# Patient Record
Sex: Female | Born: 1970 | Race: White | Hispanic: No | Marital: Married | Smoking: Former smoker
Health system: Southern US, Community
[De-identification: ages and names within clinical notes are randomized; demographics above are authoritative.]

## PROBLEM LIST (undated history)

## (undated) DIAGNOSIS — K635 Polyp of colon: Secondary | ICD-10-CM

## (undated) DIAGNOSIS — Z789 Other specified health status: Secondary | ICD-10-CM

## (undated) HISTORY — DX: Polyp of colon: K63.5

## (undated) HISTORY — PX: ABDOMINAL HYSTERECTOMY: SHX81

---

## 2004-01-09 ENCOUNTER — Inpatient Hospital Stay: Payer: Self-pay

## 2005-03-30 ENCOUNTER — Ambulatory Visit: Payer: Self-pay

## 2007-02-16 ENCOUNTER — Ambulatory Visit: Payer: Self-pay

## 2008-06-06 ENCOUNTER — Ambulatory Visit: Payer: Self-pay | Admitting: Gastroenterology

## 2008-09-02 DIAGNOSIS — E7800 Pure hypercholesterolemia, unspecified: Secondary | ICD-10-CM | POA: Insufficient documentation

## 2008-09-02 DIAGNOSIS — N958 Other specified menopausal and perimenopausal disorders: Secondary | ICD-10-CM | POA: Insufficient documentation

## 2008-09-02 DIAGNOSIS — L299 Pruritus, unspecified: Secondary | ICD-10-CM | POA: Insufficient documentation

## 2009-07-29 ENCOUNTER — Inpatient Hospital Stay: Payer: Self-pay

## 2010-06-02 ENCOUNTER — Ambulatory Visit: Payer: Self-pay

## 2011-02-12 ENCOUNTER — Ambulatory Visit: Payer: Self-pay | Admitting: Family Medicine

## 2011-10-06 ENCOUNTER — Ambulatory Visit: Payer: Self-pay

## 2014-08-01 ENCOUNTER — Telehealth: Payer: Self-pay | Admitting: Family Medicine

## 2014-08-01 DIAGNOSIS — E785 Hyperlipidemia, unspecified: Secondary | ICD-10-CM

## 2014-08-01 NOTE — Telephone Encounter (Signed)
Pt stated that she is having to clear her throat lately and it gets worse after she eats. Pt wanted to know if there was something she could try over the counter. Pt would also like to get a lab slip to have her cholesterol rechecked. Thanks TNP

## 2014-08-02 NOTE — Telephone Encounter (Signed)
May use Claritin for post nasal drip and warm saltwater gargles to relief throat irritation. Will need to order lipid panel and CMP to follow up on hyperlipidemia. Please notify patient.

## 2014-08-05 NOTE — Telephone Encounter (Signed)
Pt returning call.  CB#[phone] ext [phone]/MJ

## 2014-08-05 NOTE — Telephone Encounter (Signed)
LMTB, lab requisition printed at front desk for patient to pick up. NW

## 2014-08-05 NOTE — Telephone Encounter (Signed)
Patient advised as directed below. Patient verbalized understanding.NW

## 2015-04-24 LAB — HM MAMMOGRAPHY

## 2015-06-12 ENCOUNTER — Encounter: Payer: Self-pay | Admitting: Family Medicine

## 2015-06-19 DIAGNOSIS — Z8349 Family history of other endocrine, nutritional and metabolic diseases: Secondary | ICD-10-CM | POA: Insufficient documentation

## 2015-06-19 DIAGNOSIS — R42 Dizziness and giddiness: Secondary | ICD-10-CM | POA: Insufficient documentation

## 2015-06-19 DIAGNOSIS — F439 Reaction to severe stress, unspecified: Secondary | ICD-10-CM | POA: Insufficient documentation

## 2015-06-19 DIAGNOSIS — F4322 Adjustment disorder with anxiety: Secondary | ICD-10-CM | POA: Insufficient documentation

## 2015-06-19 DIAGNOSIS — J45991 Cough variant asthma: Secondary | ICD-10-CM | POA: Insufficient documentation

## 2015-06-19 DIAGNOSIS — G5600 Carpal tunnel syndrome, unspecified upper limb: Secondary | ICD-10-CM | POA: Insufficient documentation

## 2015-06-21 ENCOUNTER — Encounter: Payer: Self-pay | Admitting: Family Medicine

## 2015-06-21 ENCOUNTER — Ambulatory Visit (INDEPENDENT_AMBULATORY_CARE_PROVIDER_SITE_OTHER): Payer: PRIVATE HEALTH INSURANCE | Admitting: Family Medicine

## 2015-06-21 VITALS — BP 110/80 | HR 100 | Temp 98.7°F | Resp 20 | Wt 160.0 lb

## 2015-06-21 DIAGNOSIS — J302 Other seasonal allergic rhinitis: Secondary | ICD-10-CM

## 2015-06-21 DIAGNOSIS — J45991 Cough variant asthma: Secondary | ICD-10-CM | POA: Diagnosis not present

## 2015-06-21 MED ORDER — AZELASTINE-FLUTICASONE 137-50 MCG/ACT NA SUSP: 1.0000 | Freq: Two times a day (BID) | NASAL | Status: DC

## 2015-06-21 MED ORDER — ALBUTEROL SULFATE HFA 108 (90 BASE) MCG/ACT IN AERS: 2.0000 | INHALATION_SPRAY | Freq: Four times a day (QID) | RESPIRATORY_TRACT | Status: DC | PRN

## 2015-06-21 MED ORDER — FLUTICASONE-SALMETEROL 250-50 MCG/DOSE IN AEPB: 1.0000 | INHALATION_SPRAY | Freq: Two times a day (BID) | RESPIRATORY_TRACT | Status: DC

## 2015-06-21 NOTE — Progress Notes (Signed)
Patient ID: Heather Robertson, female   DOB: Jan 15, 1971, 45 y.o.   MRN: [id number]       Patient: Heather Robertson Female    DOB: Jan 15, 1971   45 y.o.   MRN: [id number] Visit Date: 06/21/2015  Today's Provider: Dortha Kernennis Chrismon, PA   Chief Complaint  Patient presents with  . Cough   Subjective:    Cough This is a new problem. The current episode started in the past 7 days. The problem has been gradually worsening. The cough is non-productive. Associated symptoms include nasal congestion, postnasal drip, rhinorrhea, shortness of breath and wheezing. The symptoms are aggravated by lying down. Treatments tried: claritin, Tylenol sinus. The treatment provided no relief. Her past medical history is significant for asthma and environmental allergies.    No past medical history on file. Patient Active Problem List   Diagnosis Date Noted  . Adjustment disorder with anxiety 06/19/2015  . Cough variant asthma 06/19/2015  . Carpal tunnel syndrome 06/19/2015  . Dizziness 06/19/2015  . Family history of alpha 1 antitrypsin deficiency 06/19/2015  . Feeling stressed out 06/19/2015  . Itch 09/02/2008  . Pure hypercholesterolemia 09/02/2008  . Artificial menopause 09/02/2008   Past Surgical History  Procedure Laterality Date  . Abdominal hysterectomy     Family History  Problem Relation Age of Onset  . Colon cancer Father   . Asthma Sister   . Hypertension Sister   . Alpha-1 antitrypsin deficiency Brother   . Leukemia Maternal Grandmother   . Heart attack Paternal Grandmother   . Hypertension Paternal Grandfather   . Diabetes Paternal Grandfather   . CVA Paternal Grandfather    Allergies  Allergen Reactions  . Azithromycin Hives  . Penicillins    Previous Medications   ESTROGENS, CONJUGATED, (PREMARIN) 0.625 MG TABLET        Review of Systems  HENT: Positive for postnasal drip and rhinorrhea.   Respiratory: Positive for cough, shortness of breath and wheezing.     Allergic/Immunologic: Positive for environmental allergies.    Social History  Substance Use Topics  . Smoking status: Former Games developermoker  . Smokeless tobacco: Never Used  . Alcohol Use: No   Objective:   BP 110/80 mmHg  Pulse 100  Temp(Src) 98.7 F (37.1 C) (Oral)  Resp 20  Wt 160 lb (72.576 kg)  SpO2 97%  Physical Exam  Constitutional: She is oriented to person, place, and time. She appears well-developed and well-nourished. No distress.  HENT:  Head: Normocephalic and atraumatic.  Right Ear: Hearing and external ear normal.  Left Ear: Hearing and external ear normal.  Nose: Nose normal.  Mouth/Throat: Oropharynx is clear and moist.  Eyes: Conjunctivae, EOM and lids are normal. Right eye exhibits no discharge. Left eye exhibits no discharge. No scleral icterus.  Neck: Neck supple.  Cardiovascular: Normal rate and regular rhythm.   Pulmonary/Chest: Effort normal and breath sounds normal. No respiratory distress.  Abdominal: Soft. Bowel sounds are normal.  Musculoskeletal: Normal range of motion.  Lymphadenopathy:    She has no cervical adenopathy.  Neurological: She is alert and oriented to person, place, and time.  Skin: Skin is intact. No lesion and no rash noted.  Psychiatric: She has a normal mood and affect. Her speech is normal and behavior is normal. Thought content normal.      Assessment & Plan:     1. Cough variant asthma Onset 2 days ago after exposure to yard work, pollen and dust 5 days ago. No fever or  sputum production. May use Advair BID and Albuterol prn rescue. Add Delsym or Mucinex-DM prn cough. Increase fluid intake and recheck prn. - Fluticasone-Salmeterol (ADVAIR DISKUS) 250-50 MCG/DOSE AEPB; Inhale 1 puff into the lungs 2 (two) times daily.  Dispense: 14 each; Refill: 0 - albuterol (PROVENTIL HFA;VENTOLIN HFA) 108 (90 Base) MCG/ACT inhaler; Inhale 2 puffs into the lungs every 6 (six) hours as needed for wheezing or shortness of breath.  Dispense: 1  Inhaler; Refill: 0  2. Other seasonal allergic rhinitis Sneezing and rhinorrhea over the past 4-5 days after yard work. Will give Dymista nasal spray BID and recheck if needed. - Azelastine-Fluticasone (DYMISTA) 137-50 MCG/ACT SUSP; Place 1 spray into the nose 2 (two) times daily.  Dispense: 6 g; Refill: 0       Dortha Kern, PA  [facility]

## 2015-06-24 ENCOUNTER — Encounter: Payer: Self-pay | Admitting: Family Medicine

## 2015-06-24 ENCOUNTER — Ambulatory Visit (INDEPENDENT_AMBULATORY_CARE_PROVIDER_SITE_OTHER): Payer: PRIVATE HEALTH INSURANCE | Admitting: Family Medicine

## 2015-06-24 VITALS — BP 102/62 | HR 86 | Temp 98.2°F | Resp 16 | Ht 62.0 in | Wt 162.6 lb

## 2015-06-24 DIAGNOSIS — E7800 Pure hypercholesterolemia, unspecified: Secondary | ICD-10-CM | POA: Diagnosis not present

## 2015-06-24 DIAGNOSIS — N958 Other specified menopausal and perimenopausal disorders: Secondary | ICD-10-CM

## 2015-06-24 DIAGNOSIS — Z8349 Family history of other endocrine, nutritional and metabolic diseases: Secondary | ICD-10-CM | POA: Diagnosis not present

## 2015-06-24 DIAGNOSIS — Z0000 Encounter for general adult medical examination without abnormal findings: Secondary | ICD-10-CM

## 2015-06-24 LAB — POCT URINALYSIS DIPSTICK
BILIRUBIN UA: NEGATIVE
Blood, UA: NEGATIVE
GLUCOSE UA: NEGATIVE
KETONES UA: NEGATIVE
LEUKOCYTES UA: NEGATIVE
NITRITE UA: NEGATIVE
Protein, UA: NEGATIVE
Spec Grav, UA: 1.025
Urobilinogen, UA: 0.2
pH, UA: 6

## 2015-06-24 NOTE — Progress Notes (Signed)
Patient ID: Heather Robertson, female   DOB: 08/12/1970, 46 y.o.   MRN: [id number]       Patient: Heather Robertson, Female    DOB: 05-Jul-1970, 45 y.o.   MRN: [id number] Visit Date: 06/24/2015  Today's Provider: Dortha Kern, PA   Chief Complaint  Patient presents with  . Annual Exam   Subjective:    Annual physical exam Heather Robertson is a 45 y.o. female who presents today for health maintenance and complete physical. She feels well. She reports exercising 2-3 days per week. She reports she is sleeping 5-7 hours per night.  -----------------------------------------------------------------  Tdap: 12/31/2011 Mammogram: 04/24/2015 Pap Smear: 04/24/2015   Review of Systems  Constitutional: Positive for fatigue.  HENT: Positive for sinus pressure.   Eyes:       Blurred vision at times   Respiratory: Positive for shortness of breath and wheezing.   Cardiovascular: Negative.   Gastrointestinal: Negative.   Endocrine: Negative.   Genitourinary: Negative.   Musculoskeletal: Negative.   Skin: Negative.   Allergic/Immunologic: Positive for environmental allergies.  Neurological: Negative.   Hematological: Negative.   Psychiatric/Behavioral: Negative.     Social History      She  reports that she has quit smoking. She has never used smokeless tobacco. She reports that she does not drink alcohol or use illicit drugs.       Social History   Social History  . Marital Status: Married    Spouse Name: N/A  . Number of Children: N/A  . Years of Education: N/A   Social History Main Topics  . Smoking status: Former Games developer  . Smokeless tobacco: Never Used  . Alcohol Use: No  . Drug Use: No  . Sexual Activity: Not Asked   Other Topics Concern  . None   Social History Narrative    History reviewed. No pertinent past medical history.   Patient Active Problem List   Diagnosis Date Noted  . Adjustment disorder with anxiety 06/19/2015  . Cough variant asthma 06/19/2015  .  Carpal tunnel syndrome 06/19/2015  . Dizziness 06/19/2015  . Family history of alpha 1 antitrypsin deficiency 06/19/2015  . Feeling stressed out 06/19/2015  . Itch 09/02/2008  . Pure hypercholesterolemia 09/02/2008  . Artificial menopause 09/02/2008    Past Surgical History  Procedure Laterality Date  . Abdominal hysterectomy      Family History        Family Status  Relation Status Death Age  . Mother Alive   . Father Alive   . Sister Alive   . Brother Alive   . Maternal Grandmother Deceased   . Maternal Grandfather Deceased     MVA  . Paternal Grandmother Deceased   . Paternal Grandfather Deceased   . Brother Alive         Her family history includes Alpha-1 antitrypsin deficiency in her brother; Asthma in her sister; CVA in her paternal grandfather; Colon cancer in her father; Diabetes in her paternal grandfather; Heart attack in her paternal grandmother; Hypertension in her paternal grandfather and sister; Leukemia in her maternal grandmother.    Allergies  Allergen Reactions  . Azithromycin Hives  . Penicillins     Previous Medications   ALBUTEROL (PROVENTIL HFA;VENTOLIN HFA) 108 (90 BASE) MCG/ACT INHALER    Inhale 2 puffs into the lungs every 6 (six) hours as needed for wheezing or shortness of breath.   AZELASTINE-FLUTICASONE (DYMISTA) 137-50 MCG/ACT SUSP    Place 1 spray into the nose 2 (  two) times daily.   ESTROGENS, CONJUGATED, (PREMARIN) 0.625 MG TABLET       FLUTICASONE-SALMETEROL (ADVAIR DISKUS) 250-50 MCG/DOSE AEPB    Inhale 1 puff into the lungs 2 (two) times daily.    Patient Care Team: Tamsen Roersennis E Chrismon, PA as PCP - General (Family Medicine)     Objective:   Vitals: BP 102/62 mmHg  Pulse 86  Temp(Src) 98.2 F (36.8 C) (Oral)  Resp 16  Ht 5\' 2"  (1.575 m)  Wt 162 lb 9.6 oz (73.755 kg)  BMI 29.73 kg/m2   Physical Exam  Constitutional: She is oriented to person, place, and time. She appears well-developed and well-nourished.  HENT:  Head:  Normocephalic and atraumatic.  Right Ear: External ear normal.  Left Ear: External ear normal.  Nose: Nose normal.  Mouth/Throat: Oropharynx is clear and moist.  Eyes: Conjunctivae and EOM are normal. Pupils are equal, round, and reactive to light. Right eye exhibits no discharge.  Neck: Normal range of motion. Neck supple. No tracheal deviation present. No thyromegaly present.  Cardiovascular: Normal rate, regular rhythm, normal heart sounds and intact distal pulses.   No murmur heard. Pulmonary/Chest: Effort normal and breath sounds normal. No respiratory distress. She has no wheezes. She has no rales. She exhibits no tenderness.  Abdominal: Soft. She exhibits no distension and no mass. There is no tenderness. There is no rebound and no guarding.  Genitourinary:  Pelvic and pap with mammograms by Dr. Luella Cookosenow (all tests normal) on 04-24-15.  Musculoskeletal: Normal range of motion. She exhibits no edema or tenderness.  Lymphadenopathy:    She has no cervical adenopathy.  Neurological: She is alert and oriented to person, place, and time. She has normal reflexes. No cranial nerve deficit. She exhibits normal muscle tone. Coordination normal.  Skin: Skin is warm and dry. No rash noted. No erythema.  Psychiatric: She has a normal mood and affect. Her behavior is normal. Judgment and thought content normal.   Depression Screen PHQ 2/9 Scores 06/24/2015  PHQ - 2 Score 0    Assessment & Plan:     Routine Health Maintenance and Physical Exam  Exercise Activities and Dietary recommendations Goals    Physically active with helping daughter in softball and occasionally go to the gym. Encouraged to follow low fat diet.      Immunization History  Administered Date(s) Administered  . Tdap 12/31/2011    Health Maintenance  Topic Date Due  . HIV Screening  03/11/1985  . INFLUENZA VACCINE  09/30/2015  . PAP SMEAR  04/23/2018  . TETANUS/TDAP  12/30/2021      Discussed health benefits  of physical activity, and encouraged her to engage in regular exercise appropriate for her age and condition.    -------------------------------------------------------------------- 1. Annual physical exam Good general health, GYN (Dr. Luella Cookosenow), got normal PAP, breast exam, PAP and Mammograms. Immunizations up to date. Recheck routine labs. - POCT urinalysis dipstick  2. Pure hypercholesterolemia Feeling well. Recommend low fat diet and exercise. Will check follow up labs. Last lipid panel on 05-07-14 showed total cholesterol 252, triglycerides 173, HDL 49 and LDL 168. Recheck routine labs and lipid panel. - CBC with Differential/Platelet - Comprehensive metabolic panel - Lipid panel - TSH  3. Artificial menopause Still on Premarin 0.625 mg qd per Dr. Luella Cookosenow. Had abdominal hysterectomy in 2003.  4. Family history of alpha 1 antitrypsin deficiency Brother having lung disease from this deficiency. This patient had a normal alpha-1-antitrypsin deficiency test (94 mg/dl) on 16-11-9610-14-12.

## 2015-06-24 NOTE — Patient Instructions (Signed)
Health Maintenance, Female Adopting a healthy lifestyle and getting preventive care can go a long way to promote health and wellness. Talk with your health care provider about what schedule of regular examinations is right for you. This is a good chance for you to check in with your provider about disease prevention and staying healthy. In between checkups, there are plenty of things you can do on your own. Experts have done a lot of research about which lifestyle changes and preventive measures are most likely to keep you healthy. Ask your health care provider for more information. WEIGHT AND DIET  Eat a healthy diet  Be sure to include plenty of vegetables, fruits, low-fat dairy products, and lean protein.  Do not eat a lot of foods high in solid fats, added sugars, or salt.  Get regular exercise. This is one of the most important things you can do for your health.  Most adults should exercise for at least 150 minutes each week. The exercise should increase your heart rate and make you sweat (moderate-intensity exercise).  Most adults should also do strengthening exercises at least twice a week. This is in addition to the moderate-intensity exercise.  Maintain a healthy weight  Body mass index (BMI) is a measurement that can be used to identify possible weight problems. It estimates body fat based on height and weight. Your health care provider can help determine your BMI and help you achieve or maintain a healthy weight.  For females 20 years of age and older:   A BMI below 18.5 is considered underweight.  A BMI of 18.5 to 24.9 is normal.  A BMI of 25 to 29.9 is considered overweight.  A BMI of 30 and above is considered obese.  Watch levels of cholesterol and blood lipids  You should start having your blood tested for lipids and cholesterol at 45 years of age, then have this test every 5 years.  You may need to have your cholesterol levels checked more often if:  Your lipid  or cholesterol levels are high.  You are older than 45 years of age.  You are at high risk for heart disease.  CANCER SCREENING   Lung Cancer  Lung cancer screening is recommended for adults 55-80 years old who are at high risk for lung cancer because of a history of smoking.  A yearly low-dose CT scan of the lungs is recommended for people who:  Currently smoke.  Have quit within the past 15 years.  Have at least a 30-pack-year history of smoking. A pack year is smoking an average of one pack of cigarettes a day for 1 year.  Yearly screening should continue until it has been 15 years since you quit.  Yearly screening should stop if you develop a health problem that would prevent you from having lung cancer treatment.  Breast Cancer  Practice breast self-awareness. This means understanding how your breasts normally appear and feel.  It also means doing regular breast self-exams. Let your health care provider know about any changes, no matter how small.  If you are in your 20s or 30s, you should have a clinical breast exam (CBE) by a health care provider every 1-3 years as part of a regular health exam.  If you are 40 or older, have a CBE every year. Also consider having a breast X-ray (mammogram) every year.  If you have a family history of breast cancer, talk to your health care provider about genetic screening.  If you   are at high risk for breast cancer, talk to your health care provider about having an MRI and a mammogram every year.  Breast cancer gene (BRCA) assessment is recommended for women who have family members with BRCA-related cancers. BRCA-related cancers include:  Breast.  Ovarian.  Tubal.  Peritoneal cancers.  Results of the assessment will determine the need for genetic counseling and BRCA1 and BRCA2 testing. Cervical Cancer Your health care provider may recommend that you be screened regularly for cancer of the pelvic organs (ovaries, uterus, and  vagina). This screening involves a pelvic examination, including checking for microscopic changes to the surface of your cervix (Pap test). You may be encouraged to have this screening done every 3 years, beginning at age 21.  For women ages 30-65, health care providers may recommend pelvic exams and Pap testing every 3 years, or they may recommend the Pap and pelvic exam, combined with testing for human papilloma virus (HPV), every 5 years. Some types of HPV increase your risk of cervical cancer. Testing for HPV may also be done on women of any age with unclear Pap test results.  Other health care providers may not recommend any screening for nonpregnant women who are considered low risk for pelvic cancer and who do not have symptoms. Ask your health care provider if a screening pelvic exam is right for you.  If you have had past treatment for cervical cancer or a condition that could lead to cancer, you need Pap tests and screening for cancer for at least 20 years after your treatment. If Pap tests have been discontinued, your risk factors (such as having a new sexual partner) need to be reassessed to determine if screening should resume. Some women have medical problems that increase the chance of getting cervical cancer. In these cases, your health care provider may recommend more frequent screening and Pap tests. Colorectal Cancer  This type of cancer can be detected and often prevented.  Routine colorectal cancer screening usually begins at 45 years of age and continues through 45 years of age.  Your health care provider may recommend screening at an earlier age if you have risk factors for colon cancer.  Your health care provider may also recommend using home test kits to check for hidden blood in the stool.  A small camera at the end of a tube can be used to examine your colon directly (sigmoidoscopy or colonoscopy). This is done to check for the earliest forms of colorectal  cancer.  Routine screening usually begins at age 50.  Direct examination of the colon should be repeated every 5-10 years through 45 years of age. However, you may need to be screened more often if early forms of precancerous polyps or small growths are found. Skin Cancer  Check your skin from head to toe regularly.  Tell your health care provider about any new moles or changes in moles, especially if there is a change in a mole's shape or color.  Also tell your health care provider if you have a mole that is larger than the size of a pencil eraser.  Always use sunscreen. Apply sunscreen liberally and repeatedly throughout the day.  Protect yourself by wearing long sleeves, pants, a wide-brimmed hat, and sunglasses whenever you are outside. HEART DISEASE, DIABETES, AND HIGH BLOOD PRESSURE   High blood pressure causes heart disease and increases the risk of stroke. High blood pressure is more likely to develop in:  People who have blood pressure in the high end   of the normal range (130-139/85-89 mm Hg).  People who are overweight or obese.  People who are African American.  If you are 38-23 years of age, have your blood pressure checked every 3-5 years. If you are 61 years of age or older, have your blood pressure checked every year. You should have your blood pressure measured twice--once when you are at a hospital or clinic, and once when you are not at a hospital or clinic. Record the average of the two measurements. To check your blood pressure when you are not at a hospital or clinic, you can use:  An automated blood pressure machine at a pharmacy.  A home blood pressure monitor.  If you are between 45 years and 39 years old, ask your health care provider if you should take aspirin to prevent strokes.  Have regular diabetes screenings. This involves taking a blood sample to check your fasting blood sugar level.  If you are at a normal weight and have a low risk for diabetes,  have this test once every three years after 45 years of age.  If you are overweight and have a high risk for diabetes, consider being tested at a younger age or more often. PREVENTING INFECTION  Hepatitis B  If you have a higher risk for hepatitis B, you should be screened for this virus. You are considered at high risk for hepatitis B if:  You were born in a country where hepatitis B is common. Ask your health care provider which countries are considered high risk.  Your parents were born in a high-risk country, and you have not been immunized against hepatitis B (hepatitis B vaccine).  You have HIV or AIDS.  You use needles to inject street drugs.  You live with someone who has hepatitis B.  You have had sex with someone who has hepatitis B.  You get hemodialysis treatment.  You take certain medicines for conditions, including cancer, organ transplantation, and autoimmune conditions. Hepatitis C  Blood testing is recommended for:  Everyone born from [birth year] through 1965.  Anyone with known risk factors for hepatitis C. Sexually transmitted infections (STIs)  You should be screened for sexually transmitted infections (STIs) including gonorrhea and chlamydia if:  You are sexually active and are younger than 45 years of age.  You are older than 45 years of age and your health care provider tells you that you are at risk for this type of infection.  Your sexual activity has changed since you were last screened and you are at an increased risk for chlamydia or gonorrhea. Ask your health care provider if you are at risk.  If you do not have HIV, but are at risk, it may be recommended that you take a prescription medicine daily to prevent HIV infection. This is called pre-exposure prophylaxis (PrEP). You are considered at risk if:  You are sexually active and do not regularly use condoms or know the HIV status of your partner(s).  You take drugs by injection.  You are sexually  active with a partner who has HIV. Talk with your health care provider about whether you are at high risk of being infected with HIV. If you choose to begin PrEP, you should first be tested for HIV. You should then be tested every 3 months for as long as you are taking PrEP.  PREGNANCY   If you are premenopausal and you may become pregnant, ask your health care provider about preconception counseling.  If you may  become pregnant, take 400 to 800 micrograms (mcg) of folic acid every day.  If you want to prevent pregnancy, talk to your health care provider about birth control (contraception). OSTEOPOROSIS AND MENOPAUSE   Osteoporosis is a disease in which the bones lose minerals and strength with aging. This can result in serious bone fractures. Your risk for osteoporosis can be identified using a bone density scan.  If you are 61 years of age or older, or if you are at risk for osteoporosis and fractures, ask your health care provider if you should be screened.  Ask your health care provider whether you should take a calcium or vitamin D supplement to lower your risk for osteoporosis.  Menopause may have certain physical symptoms and risks.  Hormone replacement therapy may reduce some of these symptoms and risks. Talk to your health care provider about whether hormone replacement therapy is right for you.  HOME CARE INSTRUCTIONS   Schedule regular health, dental, and eye exams.  Stay current with your immunizations.   Do not use any tobacco products including cigarettes, chewing tobacco, or electronic cigarettes.  If you are pregnant, do not drink alcohol.  If you are breastfeeding, limit how much and how often you drink alcohol.  Limit alcohol intake to no more than 1 drink per day for nonpregnant women. One drink equals 12 ounces of beer, 5 ounces of wine, or 1 ounces of hard liquor.  Do not use street drugs.  Do not share needles.  Ask your health care provider for help if  you need support or information about quitting drugs.  Tell your health care provider if you often feel depressed.  Tell your health care provider if you have ever been abused or do not feel safe at home.   This information is not intended to replace advice given to you by your health care provider. Make sure you discuss any questions you have with your health care provider.   Document Released: 08/31/2010 Document Revised: 03/08/2014 Document Reviewed: 01/17/2013 Elsevier Interactive Patient Education [employer].

## 2015-06-26 ENCOUNTER — Telehealth: Payer: Self-pay

## 2015-06-26 LAB — LIPID PANEL
CHOL/HDL RATIO: 7 ratio — AB (ref 0.0–4.4)
Cholesterol, Total: 231 mg/dL — ABNORMAL HIGH (ref 100–199)
HDL: 33 mg/dL — AB (ref >39)
LDL CALC: 123 mg/dL — AB (ref 0–99)
TRIGLYCERIDES: 374 mg/dL — AB (ref 0–149)
VLDL Cholesterol Cal: 75 mg/dL — ABNORMAL HIGH (ref 5–40)

## 2015-06-26 LAB — CBC WITH DIFFERENTIAL/PLATELET
BASOS ABS: 0 10*3/uL (ref 0.0–0.2)
BASOS: 1 %
EOS (ABSOLUTE): 0.2 10*3/uL (ref 0.0–0.4)
Eos: 3 %
Hematocrit: 39.3 % (ref 34.0–46.6)
Hemoglobin: 13.2 g/dL (ref 11.1–15.9)
IMMATURE GRANS (ABS): 0 10*3/uL (ref 0.0–0.1)
IMMATURE GRANULOCYTES: 0 %
LYMPHS: 28 %
Lymphocytes Absolute: 1.9 10*3/uL (ref 0.7–3.1)
MCH: 29.5 pg (ref 26.6–33.0)
MCHC: 33.6 g/dL (ref 31.5–35.7)
MCV: 88 fL (ref 79–97)
MONOS ABS: 0.5 10*3/uL (ref 0.1–0.9)
Monocytes: 8 %
NEUTROS PCT: 60 %
Neutrophils Absolute: 4 10*3/uL (ref 1.4–7.0)
PLATELETS: 321 10*3/uL (ref 150–379)
RBC: 4.47 x10E6/uL (ref 3.77–5.28)
RDW: 13.2 % (ref 12.3–15.4)
WBC: 6.6 10*3/uL (ref 3.4–10.8)

## 2015-06-26 LAB — COMPREHENSIVE METABOLIC PANEL
ALT: 15 IU/L (ref 0–32)
AST: 17 IU/L (ref 0–40)
Albumin/Globulin Ratio: 1.7 (ref 1.2–2.2)
Albumin: 4.4 g/dL (ref 3.5–5.5)
Alkaline Phosphatase: 79 IU/L (ref 39–117)
BUN/Creatinine Ratio: 15 (ref 9–23)
BUN: 9 mg/dL (ref 6–24)
Bilirubin Total: 0.4 mg/dL (ref 0.0–1.2)
CALCIUM: 9.4 mg/dL (ref 8.7–10.2)
CO2: 25 mmol/L (ref 18–29)
CREATININE: 0.62 mg/dL (ref 0.57–1.00)
Chloride: 99 mmol/L (ref 96–106)
GFR, EST AFRICAN AMERICAN: 126 mL/min/{1.73_m2} (ref >59)
GFR, EST NON AFRICAN AMERICAN: 109 mL/min/{1.73_m2} (ref >59)
GLOBULIN, TOTAL: 2.6 g/dL (ref 1.5–4.5)
Glucose: 86 mg/dL (ref 65–99)
Potassium: 5.2 mmol/L (ref 3.5–5.2)
Sodium: 140 mmol/L (ref 134–144)
TOTAL PROTEIN: 7 g/dL (ref 6.0–8.5)

## 2015-06-26 LAB — TSH: TSH: 2.82 u[IU]/mL (ref 0.450–4.500)

## 2015-06-26 NOTE — Telephone Encounter (Signed)
-----   Message from Tamsen Roersennis E Chrismon, [place] sent at 06/26/2015 11:55 AM EDT ----- All blood tests normal except cholesterol and triglycerides worse. Need Pravastatin or Lipitor 20 mg qd #30 & 3 RF. Recheck blood levels in 3 months.

## 2015-06-26 NOTE — Telephone Encounter (Signed)
LMTCB

## 2015-06-26 NOTE — Telephone Encounter (Signed)
Patient advised as directed below. Patient verbalized understanding.   Patient is requesting to try lifestyle changes before starting medication, and following up in 3 months.   Is that okay?

## 2015-06-27 ENCOUNTER — Telehealth: Payer: Self-pay | Admitting: Family Medicine

## 2015-06-27 MED ORDER — CETIRIZINE HCL 10 MG PO TABS: 10.0000 mg | ORAL_TABLET | Freq: Every day | ORAL | Status: DC

## 2015-06-27 MED ORDER — BENZONATATE 200 MG PO CAPS: 200.0000 mg | ORAL_CAPSULE | Freq: Three times a day (TID) | ORAL | Status: DC | PRN

## 2015-06-27 NOTE — Telephone Encounter (Signed)
Pt was in last Saturday and then Tuesday for a physical.  She said she has had chest congestion ongoing since she was in Saturday and is still not much better.  She wants to know if there is anything else she can take  Call back is [phone]  Thanks,  Barth Kirksteri

## 2015-06-27 NOTE — Telephone Encounter (Signed)
Sent in medication listed below. Patient advised. Correct pharmacy was listed.

## 2015-06-27 NOTE — Telephone Encounter (Signed)
May try one more time as long as we recheck progress in 3 months. Last check of cholesterol, before this time, was in March of 2016 and everything was high at that time. Low fat diet and regular exercise is the basis for control of cholesterol. May use extra fiber like Metamucil 1 tsp in 4-6 ounces of juice or water twice a day and Krill Oil daily.

## 2015-06-27 NOTE — Telephone Encounter (Signed)
Patient wants to start benzonatate and zyrtec first to see if that helps with her cough. Could you please write the instructions for the benzonatate? Thanks!

## 2015-06-27 NOTE — Telephone Encounter (Signed)
Benzonatate 200 mg one TID as needed for cough #21. The last pharmacy used was [facility] [address].

## 2015-06-27 NOTE — Telephone Encounter (Signed)
Please advise 

## 2015-06-27 NOTE — Telephone Encounter (Signed)
No sign of bacterial infection on blood counts reported on 06-25-15. If still using the Albuterol and Advair inhalers with Dymista nasal spray, we can add Zyrtec daily and call in Benzonatate for cough. If coughing up mucus with heavy color or blood in it, should check chest x-ray. Sometimes we have to consider prednisone dose packs, anyway. If this regimen doesn't work, will need to refer to an allergist or pulmonologist.

## 2015-06-27 NOTE — Telephone Encounter (Signed)
LMTCB

## 2015-06-27 NOTE — Telephone Encounter (Signed)
Patient advised as directed below. Patient verbalized understanding.  

## 2015-09-26 ENCOUNTER — Encounter: Payer: Self-pay | Admitting: Family Medicine

## 2015-09-26 ENCOUNTER — Ambulatory Visit (INDEPENDENT_AMBULATORY_CARE_PROVIDER_SITE_OTHER): Payer: PRIVATE HEALTH INSURANCE | Admitting: Family Medicine

## 2015-09-26 VITALS — BP 110/62 | HR 79 | Temp 98.6°F | Resp 16 | Wt 164.0 lb

## 2015-09-26 DIAGNOSIS — H6123 Impacted cerumen, bilateral: Secondary | ICD-10-CM

## 2015-09-26 NOTE — Progress Notes (Signed)
Patient: Heather Robertson Female    DOB: 1971/01/16   45 y.o.   MRN: [id number] Visit Date: 09/26/2015  Today's Provider: Dortha Kern, PA   Chief Complaint  Patient presents with  . Ear Fullness    x 2 months   Subjective:    Ear Fullness   There is pain in the left ear. Episode onset: 2 months ago. The problem occurs constantly. The problem has been unchanged. There has been no fever. The patient is experiencing no pain. Associated symptoms include hearing loss (decrease in hearing). Pertinent negatives include no abdominal pain, coughing, diarrhea, ear discharge, headaches, neck pain, rash, rhinorrhea, sore throat or vomiting. She has tried nothing for the symptoms.   No past medical history on file. Patient Active Problem List   Diagnosis Date Noted  . Adjustment disorder with anxiety 06/19/2015  . Cough variant asthma 06/19/2015  . Carpal tunnel syndrome 06/19/2015  . Dizziness 06/19/2015  . Family history of alpha 1 antitrypsin deficiency 06/19/2015  . Feeling stressed out 06/19/2015  . Itch 09/02/2008  . Pure hypercholesterolemia 09/02/2008  . Artificial menopause 09/02/2008   Past Surgical History:  Procedure Laterality Date  . ABDOMINAL HYSTERECTOMY     Family History  Problem Relation Age of Onset  . Colon cancer Father   . Asthma Sister   . Hypertension Sister   . Alpha-1 antitrypsin deficiency Brother   . Leukemia Maternal Grandmother   . Heart attack Paternal Grandmother   . Hypertension Paternal Grandfather   . Diabetes Paternal Grandfather   . CVA Paternal Grandfather    Allergies  Allergen Reactions  . Azithromycin Hives  . Penicillins    Current Meds  Medication Sig  . albuterol (PROVENTIL HFA;VENTOLIN HFA) 108 (90 Base) MCG/ACT inhaler Inhale 2 puffs into the lungs every 6 (six) hours as needed for wheezing or shortness of breath.  . estrogens, conjugated, (PREMARIN) 0.625 MG tablet   . [DISCONTINUED] Azelastine-Fluticasone (DYMISTA)  137-50 MCG/ACT SUSP Place 1 spray into the nose 2 (two) times daily.  . [DISCONTINUED] benzonatate (TESSALON) 200 MG capsule Take 1 capsule (200 mg total) by mouth 3 (three) times daily as needed for cough.  . [DISCONTINUED] Fluticasone-Salmeterol (ADVAIR DISKUS) 250-50 MCG/DOSE AEPB Inhale 1 puff into the lungs 2 (two) times daily.    Review of Systems  Constitutional: Negative for appetite change, chills, fatigue and fever.  HENT: Positive for hearing loss (decrease in hearing). Negative for congestion, ear discharge, ear pain, rhinorrhea and sore throat.        Pressure in left ear  Respiratory: Negative for cough, chest tightness and shortness of breath.   Cardiovascular: Negative for chest pain and palpitations.  Gastrointestinal: Negative for abdominal pain, diarrhea, nausea and vomiting.  Musculoskeletal: Negative for neck pain.  Skin: Negative for rash.  Neurological: Negative for dizziness, weakness and headaches.    Social History  Substance Use Topics  . Smoking status: Former Games developer  . Smokeless tobacco: Never Used  . Alcohol use No   Objective:   BP 110/62   Pulse 79   Temp 98.6 F (37 C) (Oral)   Resp 16   Wt 164 lb (74.4 kg)   SpO2 97% Comment: room air  BMI 30.00 kg/m   Physical Exam  Constitutional: She is oriented to person, place, and time. She appears well-developed and well-nourished.  HENT:  Head: Normocephalic.  Nose: Nose normal.  Mouth/Throat: Oropharynx is clear and moist.  Bilateral cerumen impactions with diminished  hearing.  Eyes: Conjunctivae and EOM are normal.  Neck: Neck supple. No thyromegaly present.  Cardiovascular: Normal rate, regular rhythm and normal heart sounds.   Pulmonary/Chest: Effort normal and breath sounds normal.  Abdominal: Soft. Bowel sounds are normal.  Musculoskeletal: Normal range of motion.  Lymphadenopathy:    She has no cervical adenopathy.  Neurological: She is alert and oriented to person, place, and time.    Skin: No rash noted.  Psychiatric: She has a normal mood and affect. Her behavior is normal.      Assessment & Plan:     1. Cerumen impaction, bilateral Onset over the past couple months with some irritation and diminished hearing today. After irrigation, hearing back to normal and no sign of infection. Recheck prn.       Dortha Kern, PA  [facility]

## 2015-10-17 ENCOUNTER — Encounter: Payer: Self-pay | Admitting: Family Medicine

## 2015-10-17 ENCOUNTER — Ambulatory Visit (INDEPENDENT_AMBULATORY_CARE_PROVIDER_SITE_OTHER): Payer: PRIVATE HEALTH INSURANCE | Admitting: Family Medicine

## 2015-10-17 VITALS — BP 102/68 | HR 74 | Temp 97.7°F | Resp 16 | Wt 163.4 lb

## 2015-10-17 DIAGNOSIS — G4762 Sleep related leg cramps: Secondary | ICD-10-CM | POA: Diagnosis not present

## 2015-10-17 MED ORDER — CYCLOBENZAPRINE HCL 5 MG PO TABS: ORAL_TABLET | ORAL | 0 refills | Status: DC

## 2015-10-17 NOTE — Patient Instructions (Signed)
Discussed stretching calves before going to bed. We will call you with the lab results.

## 2015-10-17 NOTE — Progress Notes (Signed)
Subjective:     Patient ID: Trevor MaceSherry B Seneca, female   DOB: [birth year], 45 y.o.   MRN: [id number]  HPI  Chief Complaint  Patient presents with  . Leg Pain    Patient comes in office today with concerns of cramping in her left leg for the past two weeks. Patient states that she has had cramping in her calf and on the lateral side of leg. Patient states that yesterday cramping had lasted for 2hrs.   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 No heavy exercise reported though states she does occasionally have numbness/tingling in her left thigh. Currently on Krill Oil and Benefiber to lower cholesterol. Has started a MVI due to her current sx.   Review of Systems     Objective:   Physical Exam  Constitutional: She appears well-developed and well-nourished. No distress.  Cardiovascular:  Pulses:      Dorsalis pedis pulses are 2+ on the right side, and 2+ on the left side.       Posterior tibial pulses are 2+ on the right side, and 2+ on the left side.  Musculoskeletal: She exhibits no edema (of lower extremities). Tenderness: no calf tenderness though there is mild tenderness in left lateral calf near  a small bruise.  SLRs to 90 degrees without discomfort       Assessment:    1. Nocturnal leg cramps - cyclobenzaprine (FLEXERIL) 5 MG tablet; Take at bedtime as needed for leg cramps.  Dispense: 14 tablet; Refill: 0 - Renal function panel - Ferritin - Magnesium    Plan:    Discussed stretching calves before bed. Further f/u pending lab work.

## 2015-10-18 LAB — RENAL FUNCTION PANEL
Albumin: 4.1 g/dL (ref 3.5–5.5)
BUN / CREAT RATIO: 21 (ref 9–23)
BUN: 12 mg/dL (ref 6–24)
CALCIUM: 9.2 mg/dL (ref 8.7–10.2)
CHLORIDE: 101 mmol/L (ref 96–106)
CO2: 22 mmol/L (ref 18–29)
Creatinine, Ser: 0.58 mg/dL (ref 0.57–1.00)
GFR calc non Af Amer: 112 mL/min/{1.73_m2} (ref >59)
GFR, EST AFRICAN AMERICAN: 129 mL/min/{1.73_m2} (ref >59)
GLUCOSE: 84 mg/dL (ref 65–99)
POTASSIUM: 4.5 mmol/L (ref 3.5–5.2)
Phosphorus: 3.3 mg/dL (ref 2.5–4.5)
Sodium: 140 mmol/L (ref 134–144)

## 2015-10-18 LAB — FERRITIN: Ferritin: 85 ng/mL (ref 15–150)

## 2015-10-18 LAB — MAGNESIUM: Magnesium: 2.1 mg/dL (ref 1.6–2.3)

## 2015-10-20 ENCOUNTER — Telehealth: Payer: Self-pay

## 2015-10-20 NOTE — Telephone Encounter (Signed)
-----   Message from Anola Gurneyobert Chauvin, [place] sent at 10/20/2015  7:40 AM EDT ----- Lab look good. Are the calf stretches and/or muscle relaxant helping?

## 2015-10-20 NOTE — Telephone Encounter (Signed)
LMTCB 10/20/2015  Thanks,   -Jonah Nestle  

## 2015-10-20 NOTE — Telephone Encounter (Signed)
Pt advised.  She reports the stretching seems to help; she has not had a flare to where she needs the muscle relaxer.   Thanks,   -Vernona RiegerLaura

## 2015-12-04 ENCOUNTER — Ambulatory Visit (INDEPENDENT_AMBULATORY_CARE_PROVIDER_SITE_OTHER): Payer: PRIVATE HEALTH INSURANCE | Admitting: Family Medicine

## 2015-12-04 ENCOUNTER — Encounter: Payer: Self-pay | Admitting: Family Medicine

## 2015-12-04 VITALS — BP 118/72 | HR 95 | Temp 98.2°F | Resp 18 | Wt 163.0 lb

## 2015-12-04 DIAGNOSIS — J302 Other seasonal allergic rhinitis: Secondary | ICD-10-CM | POA: Diagnosis not present

## 2015-12-04 DIAGNOSIS — J45991 Cough variant asthma: Secondary | ICD-10-CM

## 2015-12-04 MED ORDER — ALBUTEROL SULFATE HFA 108 (90 BASE) MCG/ACT IN AERS: 2.0000 | INHALATION_SPRAY | Freq: Four times a day (QID) | RESPIRATORY_TRACT | 5 refills | Status: DC | PRN

## 2015-12-04 MED ORDER — FLUTICASONE-SALMETEROL 250-50 MCG/DOSE IN AEPB: 1.0000 | INHALATION_SPRAY | Freq: Two times a day (BID) | RESPIRATORY_TRACT | 0 refills | Status: DC

## 2015-12-04 MED ORDER — FLUTICASONE PROPIONATE 50 MCG/ACT NA SUSP: 2.0000 | Freq: Every day | NASAL | 2 refills | Status: DC

## 2015-12-04 NOTE — Progress Notes (Signed)
Subjective:     Patient ID: Heather MaceSherry B Caradonna, female   DOB: 01/23/1971, 45 y.o.   MRN: [id number]  HPI  Chief Complaint  Patient presents with  . Cough    Patient reports that she has had a dry cough for about 2 weeks. She reports that she does have history of asthma. She reports that it could be asthma related. She reports that she does have a rescue inhaler that she uses occasionally. She also mentions that she has wheezing mainly at night.   States she started with allergy symptoms which she treated with Zyrtec and saline nasal spray. Sinuses improved but cough did not: "I don't feel sick". Has only used the albuterol one puff one to two times a day.   Review of Systems     Objective:   Physical Exam  Constitutional: She appears well-developed and well-nourished. No distress.  Ears: T.M's intact without inflammation Throat: no tonsillar enlargement or exudate Neck: no cervical adenopathy Lungs: clear     Assessment:    1. Cough variant asthma - Fluticasone-Salmeterol (ADVAIR DISKUS) 250-50 MCG/DOSE AEPB; Inhale 1 puff into the lungs 2 (two) times daily.  Dispense: 14 each; Refill: 0 - albuterol (PROVENTIL HFA;VENTOLIN HFA) 108 (90 Base) MCG/ACT inhaler; Inhale 2 puffs into the lungs every 6 (six) hours as needed for wheezing or shortness of breath.  Dispense: 1 Inhaler; Refill: 5  2. Other seasonal allergic rhinitis - fluticasone (FLONASE) 50 MCG/ACT nasal spray; Place 2 sprays into both nostrils daily.  Dispense: 16 g; Refill: 2    Plan:    Discussed scheduling the rescue inhaler until steroids controlling her sx. Suggested controlling her allergies with steroid nasal spray may also prevent the asthma.

## 2015-12-04 NOTE — Patient Instructions (Signed)
Schedule the rescue inhaler 2-4 x day until the steroid inhalers are controlling your symptoms.

## 2016-01-27 ENCOUNTER — Ambulatory Visit (INDEPENDENT_AMBULATORY_CARE_PROVIDER_SITE_OTHER): Payer: PRIVATE HEALTH INSURANCE | Admitting: Family Medicine

## 2016-01-27 ENCOUNTER — Encounter: Payer: Self-pay | Admitting: Family Medicine

## 2016-01-27 VITALS — BP 124/86 | HR 92 | Temp 98.7°F | Resp 16 | Wt 167.4 lb

## 2016-01-27 DIAGNOSIS — E7800 Pure hypercholesterolemia, unspecified: Secondary | ICD-10-CM

## 2016-01-27 DIAGNOSIS — R079 Chest pain, unspecified: Secondary | ICD-10-CM | POA: Diagnosis not present

## 2016-01-27 NOTE — Patient Instructions (Signed)
Chest Wall Pain °Chest wall pain is pain in or around the bones and muscles of your chest. Sometimes, an injury causes this pain. Sometimes, the cause may not be known. This pain may take several weeks or longer to get better. °Follow these instructions at home: °Pay attention to any changes in your symptoms. Take these actions to help with your pain: °· Rest as told by your health care provider. °· Avoid activities that cause pain. These include any activities that use your chest muscles or your abdominal and side muscles to lift heavy items. °· If directed, apply ice to the painful area: °¨ Put ice in a plastic bag. °¨ Place a towel between your skin and the bag. °¨ Leave the ice on for 20 minutes, 2-3 times per day. °· Take over-the-counter and prescription medicines only as told by your health care provider. °· Do not use tobacco products, including cigarettes, chewing tobacco, and e-cigarettes. If you need help quitting, ask your health care provider. °· Keep all follow-up visits as told by your health care provider. This is important. °Contact a health care provider if: °· You have a fever. °· Your chest pain becomes worse. °· You have new symptoms. °Get help right away if: °· You have nausea or vomiting. °· You feel sweaty or light-headed. °· You have a cough with phlegm (sputum) or you cough up blood. °· You develop shortness of breath. °This information is not intended to replace advice given to you by your health care provider. Make sure you discuss any questions you have with your health care provider. °Document Released: 02/15/2005 Document Revised: 06/26/2015 Document Reviewed: 05/13/2014 °Elsevier Interactive Patient Education © 2017 Elsevier Inc. ° °

## 2016-01-27 NOTE — Progress Notes (Signed)
Patient: Heather Robertson Female    DOB: 10-13-1970   45 y.o.   MRN: [id number] Visit Date: 01/27/2016  Today's Provider: Dortha Kernennis Ronella Plunk, PA   Chief Complaint  Patient presents with  . Chest Pain   Subjective:    Chest Pain   This is a new problem. Episode onset: Thursday. The onset quality is sudden (episode). The problem has been resolved (rapidly improved ). The pain is present in the substernal region. The quality of the pain is described as squeezing. The pain does not radiate. Associated symptoms comments: Sleeplessness . Treatments tried: aspirin 81 mg. The treatment provided significant relief.  Her past medical history is significant for anxiety/panic attacks and hyperlipidemia.  Her family medical history is significant for heart disease.   Patient Active Problem List   Diagnosis Date Noted  . Adjustment disorder with anxiety 06/19/2015  . Cough variant asthma 06/19/2015  . Carpal tunnel syndrome 06/19/2015  . Dizziness 06/19/2015  . Family history of alpha 1 antitrypsin deficiency 06/19/2015  . Feeling stressed out 06/19/2015  . Itch 09/02/2008  . Pure hypercholesterolemia 09/02/2008  . Artificial menopause 09/02/2008   Past Surgical History:  Procedure Laterality Date  . ABDOMINAL HYSTERECTOMY     Family History  Problem Relation Age of Onset  . Colon cancer Father   . Asthma Sister   . Hypertension Sister   . Alpha-1 antitrypsin deficiency Brother   . Leukemia Maternal Grandmother   . Heart attack Paternal Grandmother   . Hypertension Paternal Grandfather   . Diabetes Paternal Grandfather   . CVA Paternal Grandfather    Allergies  Allergen Reactions  . Azithromycin Hives  . Penicillins      Previous Medications   ALBUTEROL (PROVENTIL HFA;VENTOLIN HFA) 108 (90 BASE) MCG/ACT INHALER    Inhale 2 puffs into the lungs every 6 (six) hours as needed for wheezing or shortness of breath.   CETIRIZINE (ZYRTEC) 10 MG TABLET    Take 1 tablet (10 mg total) by  mouth daily.   CYCLOBENZAPRINE (FLEXERIL) 5 MG TABLET    Take at bedtime as needed for leg cramps.   ESTROGENS, CONJUGATED, (PREMARIN) 0.625 MG TABLET       FLUTICASONE (FLONASE) 50 MCG/ACT NASAL SPRAY    Place 2 sprays into both nostrils daily.   FLUTICASONE-SALMETEROL (ADVAIR DISKUS) 250-50 MCG/DOSE AEPB    Inhale 1 puff into the lungs 2 (two) times daily.   KRILL OIL 350 MG CAPS    Take by mouth 2 (two) times daily.   MULTIPLE VITAMINS-CALCIUM (ONE-A-DAY WOMENS PO)    Take 1 tablet by mouth daily.    Review of Systems  Constitutional: Negative.   Respiratory: Negative.   Cardiovascular: Positive for chest pain.  Psychiatric/Behavioral: Positive for sleep disturbance.    Social History  Substance Use Topics  . Smoking status: Former Games developermoker  . Smokeless tobacco: Never Used  . Alcohol use No   Objective:   BP 124/86 (BP Location: Right Arm, Patient Position: Sitting, Cuff Size: Normal)   Pulse 92   Temp 98.7 F (37.1 C) (Oral)   Resp 16   Wt 167 lb 6.4 oz (75.9 kg)   BMI 30.62 kg/m   Physical Exam  Constitutional: She is oriented to person, place, and time. She appears well-developed and well-nourished. No distress.  HENT:  Head: Normocephalic and atraumatic.  Right Ear: Hearing normal.  Left Ear: Hearing normal.  Nose: Nose normal.  Eyes: Conjunctivae and lids are normal. Right eye exhibits no  discharge. Left eye exhibits no discharge. No scleral icterus.  Neck: Neck supple. No thyromegaly present.  Cardiovascular: Normal rate, regular rhythm and normal heart sounds.  Exam reveals no gallop and no friction rub.   No murmur heard. Pulmonary/Chest: Effort normal and breath sounds normal. No respiratory distress.  Abdominal: Soft. Bowel sounds are normal.  Musculoskeletal: Normal range of motion.  Neurological: She is alert and oriented to person, place, and time.  Skin: Skin is intact. No lesion and no rash noted.  Psychiatric: She has a normal mood and affect. Her  speech is normal and behavior is normal. Thought content normal.      Assessment & Plan:     1. Chest pain, unspecified type Had a "grabbing" mid chest pain while walking in the grocery store 5 days ago. Sat down to rest, took a Baby ASA and it went away. Denies nausea, dyspnea or cough. EKG shows a short PR interval but otherwise normal. Worried about possible cardiovascular event. Will get labs with troponin and schedule cardiology referral. - EKG 12-Lead - CBC with Differential/Platelet - Comprehensive metabolic panel - Lipid panel - TSH - Troponin I - Ambulatory referral to Cardiology  2. Pure hypercholesterolemia Had total cholesterol 231, triglycerides 374, HDL 33 and LDL 123 on 06-25-15. Did not want to start a statin at that time and has been on [place] Oil and trying to follow a low fat diet. Will check progress. Follow up pending lab reports. - Comprehensive metabolic panel - Lipid panel - TSH

## 2016-01-29 ENCOUNTER — Telehealth: Payer: Self-pay

## 2016-01-29 LAB — CBC WITH DIFFERENTIAL/PLATELET
BASOS: 1 %
Basophils Absolute: 0 10*3/uL (ref 0.0–0.2)
EOS (ABSOLUTE): 0.3 10*3/uL (ref 0.0–0.4)
Eos: 4 %
HEMOGLOBIN: 13 g/dL (ref 11.1–15.9)
Hematocrit: 38.5 % (ref 34.0–46.6)
IMMATURE GRANS (ABS): 0 10*3/uL (ref 0.0–0.1)
Immature Granulocytes: 0 %
LYMPHS: 29 %
Lymphocytes Absolute: 1.9 10*3/uL (ref 0.7–3.1)
MCH: 29.6 pg (ref 26.6–33.0)
MCHC: 33.8 g/dL (ref 31.5–35.7)
MCV: 88 fL (ref 79–97)
MONOCYTES: 10 %
Monocytes Absolute: 0.6 10*3/uL (ref 0.1–0.9)
NEUTROS ABS: 3.7 10*3/uL (ref 1.4–7.0)
Neutrophils: 56 %
Platelets: 338 10*3/uL (ref 150–379)
RBC: 4.39 x10E6/uL (ref 3.77–5.28)
RDW: 13.2 % (ref 12.3–15.4)
WBC: 6.5 10*3/uL (ref 3.4–10.8)

## 2016-01-29 LAB — LIPID PANEL
CHOL/HDL RATIO: 5.4 ratio — AB (ref 0.0–4.4)
Cholesterol, Total: 226 mg/dL — ABNORMAL HIGH (ref 100–199)
HDL: 42 mg/dL (ref >39)
LDL Calculated: 135 mg/dL — ABNORMAL HIGH (ref 0–99)
Triglycerides: 244 mg/dL — ABNORMAL HIGH (ref 0–149)
VLDL CHOLESTEROL CAL: 49 mg/dL — AB (ref 5–40)

## 2016-01-29 LAB — COMPREHENSIVE METABOLIC PANEL
A/G RATIO: 1.5 (ref 1.2–2.2)
ALBUMIN: 4.1 g/dL (ref 3.5–5.5)
ALT: 11 IU/L (ref 0–32)
AST: 15 IU/L (ref 0–40)
Alkaline Phosphatase: 73 IU/L (ref 39–117)
BILIRUBIN TOTAL: 0.3 mg/dL (ref 0.0–1.2)
BUN / CREAT RATIO: 19 (ref 9–23)
BUN: 12 mg/dL (ref 6–24)
CHLORIDE: 101 mmol/L (ref 96–106)
CO2: 22 mmol/L (ref 18–29)
Calcium: 9.2 mg/dL (ref 8.7–10.2)
Creatinine, Ser: 0.63 mg/dL (ref 0.57–1.00)
GFR calc non Af Amer: 109 mL/min/{1.73_m2} (ref >59)
GFR, EST AFRICAN AMERICAN: 125 mL/min/{1.73_m2} (ref >59)
Globulin, Total: 2.7 g/dL (ref 1.5–4.5)
Glucose: 91 mg/dL (ref 65–99)
POTASSIUM: 4.4 mmol/L (ref 3.5–5.2)
Sodium: 140 mmol/L (ref 134–144)
TOTAL PROTEIN: 6.8 g/dL (ref 6.0–8.5)

## 2016-01-29 LAB — TSH: TSH: 4.56 u[IU]/mL — AB (ref 0.450–4.500)

## 2016-01-29 LAB — TROPONIN I

## 2016-01-29 MED ORDER — SIMVASTATIN 20 MG PO TABS: 20.0000 mg | ORAL_TABLET | Freq: Every day | ORAL | 3 refills | Status: DC

## 2016-01-29 NOTE — Telephone Encounter (Signed)
-----   Message from Tamsen Roersennis E Chrismon, [place] sent at 01/29/2016  8:21 AM EST ----- All blood tests are normal except cholesterol and triglycerides still high (a little better than last check). No sign of heart muscle damage. Recommend Simvastatin 20 mg qd #30 & 3 RF then recheck lipids in 3 months. Proceed with cardiology evaluation as planned.

## 2016-01-29 NOTE — Telephone Encounter (Signed)
Patient has been advised. KW 

## 2016-01-29 NOTE — Telephone Encounter (Signed)
LMTCB-KW 

## 2016-03-02 DIAGNOSIS — M5416 Radiculopathy, lumbar region: Secondary | ICD-10-CM | POA: Diagnosis not present

## 2016-03-02 DIAGNOSIS — M545 Low back pain: Secondary | ICD-10-CM | POA: Diagnosis not present

## 2016-03-02 DIAGNOSIS — R079 Chest pain, unspecified: Secondary | ICD-10-CM | POA: Diagnosis not present

## 2016-03-02 DIAGNOSIS — R55 Syncope and collapse: Secondary | ICD-10-CM | POA: Diagnosis not present

## 2016-03-02 DIAGNOSIS — E669 Obesity, unspecified: Secondary | ICD-10-CM | POA: Diagnosis not present

## 2016-03-02 DIAGNOSIS — R011 Cardiac murmur, unspecified: Secondary | ICD-10-CM | POA: Diagnosis not present

## 2016-03-08 DIAGNOSIS — M545 Low back pain: Secondary | ICD-10-CM | POA: Diagnosis not present

## 2016-03-08 DIAGNOSIS — M5416 Radiculopathy, lumbar region: Secondary | ICD-10-CM | POA: Diagnosis not present

## 2016-03-10 DIAGNOSIS — M5416 Radiculopathy, lumbar region: Secondary | ICD-10-CM | POA: Diagnosis not present

## 2016-03-10 DIAGNOSIS — M545 Low back pain: Secondary | ICD-10-CM | POA: Diagnosis not present

## 2016-03-16 DIAGNOSIS — M5416 Radiculopathy, lumbar region: Secondary | ICD-10-CM | POA: Diagnosis not present

## 2016-03-16 DIAGNOSIS — M545 Low back pain: Secondary | ICD-10-CM | POA: Diagnosis not present

## 2016-03-22 DIAGNOSIS — M545 Low back pain: Secondary | ICD-10-CM | POA: Diagnosis not present

## 2016-03-22 DIAGNOSIS — M5416 Radiculopathy, lumbar region: Secondary | ICD-10-CM | POA: Diagnosis not present

## 2016-03-24 DIAGNOSIS — M545 Low back pain: Secondary | ICD-10-CM | POA: Diagnosis not present

## 2016-03-24 DIAGNOSIS — M5416 Radiculopathy, lumbar region: Secondary | ICD-10-CM | POA: Diagnosis not present

## 2016-04-23 DIAGNOSIS — L239 Allergic contact dermatitis, unspecified cause: Secondary | ICD-10-CM | POA: Diagnosis not present

## 2016-04-23 DIAGNOSIS — L299 Pruritus, unspecified: Secondary | ICD-10-CM | POA: Diagnosis not present

## 2016-04-23 DIAGNOSIS — L2089 Other atopic dermatitis: Secondary | ICD-10-CM | POA: Diagnosis not present

## 2016-04-30 ENCOUNTER — Ambulatory Visit: Payer: Self-pay | Admitting: Family Medicine

## 2016-05-03 ENCOUNTER — Telehealth: Payer: Self-pay | Admitting: Family Medicine

## 2016-05-03 NOTE — Telephone Encounter (Signed)
Pt advised. Pt called pharmacy earlier, and informed me of other allergies; morphine, cephalosporins, carbapenem, and quaternium 15. Allergy list updated. Allene DillonEmily Drozdowski, CMA

## 2016-05-03 NOTE — Telephone Encounter (Signed)
Lmtcb. Emily Drozdowski, CMA  

## 2016-05-03 NOTE — Telephone Encounter (Signed)
Pt requesting a list of all antibiotics she is allergic to.  States she has an infection in her finger and is needing to be given an RX at work and wants a list to make sure it isn't one she is allergic to.  Pt requesting us to fax it to her personal fax [phone]

## 2016-06-25 DIAGNOSIS — R6882 Decreased libido: Secondary | ICD-10-CM | POA: Diagnosis not present

## 2016-06-25 DIAGNOSIS — N3281 Overactive bladder: Secondary | ICD-10-CM | POA: Diagnosis not present

## 2016-06-25 DIAGNOSIS — Z79890 Hormone replacement therapy (postmenopausal): Secondary | ICD-10-CM | POA: Diagnosis not present

## 2016-06-25 DIAGNOSIS — Z1272 Encounter for screening for malignant neoplasm of vagina: Secondary | ICD-10-CM | POA: Diagnosis not present

## 2016-06-25 DIAGNOSIS — Z01411 Encounter for gynecological examination (general) (routine) with abnormal findings: Secondary | ICD-10-CM | POA: Diagnosis not present

## 2016-06-28 ENCOUNTER — Other Ambulatory Visit: Payer: Self-pay | Admitting: Obstetrics and Gynecology

## 2016-06-28 DIAGNOSIS — Z1231 Encounter for screening mammogram for malignant neoplasm of breast: Secondary | ICD-10-CM

## 2016-07-28 ENCOUNTER — Ambulatory Visit
Admission: RE | Admit: 2016-07-28 | Discharge: 2016-07-28 | Disposition: A | Discharge status: Home / Self Care | Payer: BLUE CROSS/BLUE SHIELD | Source: Ambulatory Visit | Attending: Obstetrics and Gynecology | Admitting: Obstetrics and Gynecology

## 2016-07-28 DIAGNOSIS — Z1231 Encounter for screening mammogram for malignant neoplasm of breast: Secondary | ICD-10-CM | POA: Diagnosis not present

## 2016-09-28 DIAGNOSIS — L578 Other skin changes due to chronic exposure to nonionizing radiation: Secondary | ICD-10-CM | POA: Diagnosis not present

## 2016-09-28 DIAGNOSIS — L308 Other specified dermatitis: Secondary | ICD-10-CM | POA: Diagnosis not present

## 2016-09-28 DIAGNOSIS — L570 Actinic keratosis: Secondary | ICD-10-CM | POA: Diagnosis not present

## 2016-09-29 DIAGNOSIS — H16143 Punctate keratitis, bilateral: Secondary | ICD-10-CM | POA: Diagnosis not present

## 2016-10-11 ENCOUNTER — Ambulatory Visit (INDEPENDENT_AMBULATORY_CARE_PROVIDER_SITE_OTHER): Payer: BLUE CROSS/BLUE SHIELD | Admitting: Family Medicine

## 2016-10-11 ENCOUNTER — Encounter: Payer: Self-pay | Admitting: Family Medicine

## 2016-10-11 ENCOUNTER — Telehealth: Payer: Self-pay | Admitting: Family Medicine

## 2016-10-11 VITALS — BP 108/84 | HR 79 | Temp 98.3°F | Wt 162.2 lb

## 2016-10-11 DIAGNOSIS — F4322 Adjustment disorder with anxiety: Secondary | ICD-10-CM

## 2016-10-11 DIAGNOSIS — M7050 Other bursitis of knee, unspecified knee: Secondary | ICD-10-CM | POA: Insufficient documentation

## 2016-10-11 DIAGNOSIS — R079 Chest pain, unspecified: Secondary | ICD-10-CM | POA: Diagnosis not present

## 2016-10-11 DIAGNOSIS — S8000XA Contusion of unspecified knee, initial encounter: Secondary | ICD-10-CM | POA: Insufficient documentation

## 2016-10-11 MED ORDER — ALPRAZOLAM 0.25 MG PO TABS: 0.2500 mg | ORAL_TABLET | Freq: Two times a day (BID) | ORAL | 0 refills | Status: DC | PRN

## 2016-10-11 NOTE — Progress Notes (Signed)
Patient: Heather MaceSherry B Micucci Female    DOB: [birth year]   46 y.o.   MRN: [id number] Visit Date: 10/11/2016  Today's Provider: Dortha Kernennis Chrismon, PA   Chief Complaint  Patient presents with  . Chest Pain   Subjective:    Chest Pain   This is a recurrent problem. Episode onset: Saturday night  The onset quality is sudden. Episode frequency: every few minutes she will have a episode. The problem has been unchanged. The pain is present in the substernal region. Quality: "starburst" pain that is really quick. Radiates to: right rib area once today while at work. Associated symptoms include palpitations (episodes every once in a while). She has tried antacids for the symptoms. The treatment provided no relief. Prior diagnostic workup includes stress echo and echocardiogram.  Patient states she had a workup with cardiologist when she had previous episode of chest pain. Last visit with cardiologist was January 2018.  Patient Active Problem List   Diagnosis Date Noted  . Contusion of knee 10/11/2016  . Pes anserinus bursitis 10/11/2016  . Adjustment disorder with anxiety 06/19/2015  . Cough variant asthma 06/19/2015  . Carpal tunnel syndrome 06/19/2015  . Dizziness 06/19/2015  . Family history of alpha 1 antitrypsin deficiency 06/19/2015  . Feeling stressed out 06/19/2015  . Itch 09/02/2008  . Pure hypercholesterolemia 09/02/2008  . Artificial menopause 09/02/2008   Past Surgical History:  Procedure Laterality Date  . ABDOMINAL HYSTERECTOMY     Family History  Problem Relation Age of Onset  . Colon cancer Father   . Asthma Sister   . Hypertension Sister   . Alpha-1 antitrypsin deficiency Brother   . Leukemia Maternal Grandmother   . Heart attack Paternal Grandmother   . Hypertension Paternal Grandfather   . Diabetes Paternal Grandfather   . CVA Paternal Grandfather   . Breast cancer Neg Hx    Allergies  Allergen Reactions  . Azithromycin Hives  . Carbapenems   . Cephalosporins   .  Morphine And Related Hives  . Penicillins Swelling  . Quaternium-15      Previous Medications   ALBUTEROL (PROVENTIL HFA;VENTOLIN HFA) 108 (90 BASE) MCG/ACT INHALER    Inhale 2 puffs into the lungs every 6 (six) hours as needed for wheezing or shortness of breath.   CYCLOBENZAPRINE (FLEXERIL) 5 MG TABLET    Take at bedtime as needed for leg cramps.   ESTROGENS, CONJUGATED, (PREMARIN) 0.625 MG TABLET       FLUTICASONE (FLONASE) 50 MCG/ACT NASAL SPRAY    Place 2 sprays into both nostrils daily.   FLUTICASONE-SALMETEROL (ADVAIR DISKUS) 250-50 MCG/DOSE AEPB    Inhale 1 puff into the lungs 2 (two) times daily.   KRILL OIL 350 MG CAPS    Take by mouth 2 (two) times daily.   MULTIPLE VITAMINS-CALCIUM (ONE-A-DAY WOMENS PO)    Take 1 tablet by mouth daily.   SIMVASTATIN (ZOCOR) 20 MG TABLET    Take 1 tablet (20 mg total) by mouth daily.    Review of Systems  Constitutional: Negative.   Respiratory: Negative.   Cardiovascular: Positive for chest pain and palpitations (episodes every once in a while).    Social History  Substance Use Topics  . Smoking status: Former Games developermoker  . Smokeless tobacco: Never Used  . Alcohol use No   Objective:   BP 108/84 (BP Location: Right Arm, Patient Position: Sitting, Cuff Size: Normal)   Pulse 79   Temp 98.3 F (36.8 C) (Oral)   Wt 162 lb  3.2 oz (73.6 kg)   SpO2 98%   BMI 29.67 kg/m   Physical Exam  Constitutional: She is oriented to person, place, and time. She appears well-developed and well-nourished. No distress.  HENT:  Head: Normocephalic and atraumatic.  Right Ear: Hearing normal.  Left Ear: Hearing normal.  Nose: Nose normal.  Eyes: Conjunctivae and lids are normal. Right eye exhibits no discharge. Left eye exhibits no discharge. No scleral icterus.  Neck: Neck supple.  Cardiovascular: Normal rate and regular rhythm.   Pulmonary/Chest: Effort normal and breath sounds normal. No respiratory distress.  Abdominal: Soft. Bowel sounds are  normal.  Musculoskeletal: Normal range of motion.  Neurological: She is alert and oriented to person, place, and time.  Skin: Skin is intact. No lesion and no rash noted.  Psychiatric: She has a normal mood and affect. Her speech is normal and behavior is normal. Thought content normal.      Assessment & Plan:     1. Chest pain, unspecified type Onset 10-09-16 of a very short "starburst" sensation in chest intermittently and lasting only a second, after moving daughter to [place] ([place]). Had a bacon hamburger for dinner and tried Tums with and ASA, but, no help. Denies dyspnea, diaphoresis, palpitations or nausea. Similar episode November 2017 when experiencing stress with mother's health. Evaluation by Dr. Juliann Pares (cardiologist) showed normal stress test and echocardiogram. Comparison of EKG from November 2017 and today, showed no acute changes. Suspect secondary to anxiety and dyspepsia. May use Zantac BID and add Xanax for prn use with stress. Recheck if no better in 10-14 days. - EKG 12-Lead - ALPRAZolam (XANAX) 0.25 MG tablet; Take 1 tablet (0.25 mg total) by mouth 2 (two) times daily as needed for anxiety.  Dispense: 30 tablet; Refill: 0  2. Adjustment disorder with anxiety Some history of anxiety and recent stress moving daughter to [place], [place]. May use Alprazolam prn BID. Recheck prn. Sleeping 7-8 hours a night. - ALPRAZolam (XANAX) 0.25 MG tablet; Take 1 tablet (0.25 mg total) by mouth 2 (two) times daily as needed for anxiety.  Dispense: 30 tablet; Refill: 0

## 2016-10-12 ENCOUNTER — Encounter: Payer: Self-pay | Admitting: Family Medicine

## 2016-11-12 DIAGNOSIS — H04123 Dry eye syndrome of bilateral lacrimal glands: Secondary | ICD-10-CM | POA: Diagnosis not present

## 2016-12-14 DIAGNOSIS — H04123 Dry eye syndrome of bilateral lacrimal glands: Secondary | ICD-10-CM | POA: Diagnosis not present

## 2017-05-13 ENCOUNTER — Ambulatory Visit (INDEPENDENT_AMBULATORY_CARE_PROVIDER_SITE_OTHER): Payer: BLUE CROSS/BLUE SHIELD | Admitting: Family Medicine

## 2017-05-13 ENCOUNTER — Encounter: Payer: Self-pay | Admitting: Family Medicine

## 2017-05-13 VITALS — BP 108/70 | HR 88 | Temp 98.2°F

## 2017-05-13 DIAGNOSIS — J040 Acute laryngitis: Secondary | ICD-10-CM | POA: Diagnosis not present

## 2017-05-13 DIAGNOSIS — J45991 Cough variant asthma: Secondary | ICD-10-CM | POA: Diagnosis not present

## 2017-05-13 MED ORDER — DOXYCYCLINE HYCLATE 100 MG PO TABS: 100.0000 mg | ORAL_TABLET | Freq: Two times a day (BID) | ORAL | 0 refills | Status: DC

## 2017-05-13 MED ORDER — PREDNISONE 5 MG PO TABS: 5.0000 mg | ORAL_TABLET | Freq: Every day | ORAL | 0 refills | Status: DC

## 2017-05-13 MED ORDER — BENZONATATE 200 MG PO CAPS: 200.0000 mg | ORAL_CAPSULE | Freq: Two times a day (BID) | ORAL | 0 refills | Status: DC | PRN

## 2017-05-13 NOTE — Progress Notes (Signed)
Patient: Heather Robertson Female    DOB: 07/02/1970   47 y.o.   MRN: [id number] Visit Date: 05/13/2017  Today's Provider: Dortha Kernennis , PA   Chief Complaint  Patient presents with  . URI   Subjective:    URI   This is a new problem. Episode onset: 3 weeks ago. The problem has been unchanged (cough is worse). There has been no fever. Associated symptoms include congestion, coughing (coughing spells) and wheezing. Associated symptoms comments: Hoarseness, bloody nasal drainage . Treatments tried: Flonase, Tussin, Inhaler, and Zyrtec.   Patient Active Problem List   Diagnosis Date Noted  . Contusion of knee 10/11/2016  . Pes anserinus bursitis 10/11/2016  . Adjustment disorder with anxiety 06/19/2015  . Cough variant asthma 06/19/2015  . Carpal tunnel syndrome 06/19/2015  . Dizziness 06/19/2015  . Family history of alpha 1 antitrypsin deficiency 06/19/2015  . Feeling stressed out 06/19/2015  . Itch 09/02/2008  . Pure hypercholesterolemia 09/02/2008  . Artificial menopause 09/02/2008   Past Surgical History:  Procedure Laterality Date  . ABDOMINAL HYSTERECTOMY     Family History  Problem Relation Age of Onset  . Colon cancer Father   . Asthma Sister   . Hypertension Sister   . Alpha-1 antitrypsin deficiency Brother   . Leukemia Maternal Grandmother   . Heart attack Paternal Grandmother   . Hypertension Paternal Grandfather   . Diabetes Paternal Grandfather   . CVA Paternal Grandfather   . Breast cancer Neg Hx    Allergies  Allergen Reactions  . Azithromycin Hives  . Carbapenems   . Cephalosporins   . Morphine And Related Hives  . Penicillins Swelling  . Quaternium-15     Current Outpatient Medications:  .  albuterol (PROVENTIL HFA;VENTOLIN HFA) 108 (90 Base) MCG/ACT inhaler, Inhale 2 puffs into the lungs every 6 (six) hours as needed for wheezing or shortness of breath., Disp: 1 Inhaler, Rfl: 5 .  estrogens, conjugated, (PREMARIN) 0.625 MG tablet, ,  Disp: , Rfl:  .  fluticasone (FLONASE) 50 MCG/ACT nasal spray, Place 2 sprays into both nostrils daily., Disp: 16 g, Rfl: 2 .  Fluticasone-Salmeterol (ADVAIR DISKUS) 250-50 MCG/DOSE AEPB, Inhale 1 puff into the lungs 2 (two) times daily., Disp: 14 each, Rfl: 0 .  ALPRAZolam (XANAX) 0.25 MG tablet, Take 1 tablet (0.25 mg total) by mouth 2 (two) times daily as needed for anxiety. (Patient not taking: Reported on 05/13/2017), Disp: 30 tablet, Rfl: 0 .  cyclobenzaprine (FLEXERIL) 5 MG tablet, Take at bedtime as needed for leg cramps. (Patient not taking: Reported on 05/13/2017), Disp: 14 tablet, Rfl: 0 .  Krill Oil 350 MG CAPS, Take by mouth 2 (two) times daily., Disp: , Rfl:  .  Multiple Vitamins-Calcium (ONE-A-DAY WOMENS PO), Take 1 tablet by mouth daily., Disp: , Rfl:  .  simvastatin (ZOCOR) 20 MG tablet, Take 1 tablet (20 mg total) by mouth daily. (Patient not taking: Reported on 10/11/2016), Disp: 30 tablet, Rfl: 3  Review of Systems  Constitutional: Negative.   HENT: Positive for congestion.   Respiratory: Positive for cough (coughing spells) and wheezing.   Cardiovascular: Negative.    Social History   Tobacco Use  . Smoking status: Former Games developermoker  . Smokeless tobacco: Never Used  Substance Use Topics  . Alcohol use: No    Alcohol/week: 0.0 oz   Objective:   BP 108/70 (BP Location: Right Arm, Patient Position: Sitting, Cuff Size: Normal)   Pulse 88   Temp  98.2 F (36.8 C) (Oral)   SpO2 98%   Physical Exam  Constitutional: She is oriented to person, place, and time. She appears well-developed and well-nourished. No distress.  HENT:  Head: Normocephalic and atraumatic.  Right Ear: Hearing and external ear normal.  Left Ear: Hearing and external ear normal.  Nose: Nose normal.  Mouth/Throat: Oropharynx is clear and moist.  Eyes: Conjunctivae and lids are normal. Right eye exhibits no discharge. Left eye exhibits no discharge. No scleral icterus.  Neck: Neck supple.    Cardiovascular: Normal rate.  Pulmonary/Chest: Effort normal and breath sounds normal. No respiratory distress.  Abdominal: Soft. Bowel sounds are normal.  Musculoskeletal: Normal range of motion.  Lymphadenopathy:    She has no cervical adenopathy.  Neurological: She is alert and oriented to person, place, and time.  Skin: Skin is intact. No lesion and no rash noted.  Psychiatric: She has a normal mood and affect. Her speech is normal and behavior is normal. Thought content normal.      Assessment & Plan:     1. Cough variant asthma Cough with occasional wheeze over the past week. Some recent laryngitis. No fever or sore throat. No purulent sputum production. Advair 250/50 mcg/ACT BID, Zyrtec qd, Flonase qd not much help in the past 1.5 weeks. Increase fluid intake, add prednisone taper and given Doxycycline to take if symptoms worsen or signs of infection. Given Benzonatate to help control cough. Recheck prn. - predniSONE (DELTASONE) 5 MG tablet; Take 1 tablet (5 mg total) by mouth daily with breakfast. Taper down by one tablet daily (6,5,4,3,2,1)  Dispense: 21 tablet; Refill: 0 - doxycycline (VIBRA-TABS) 100 MG tablet; Take 1 tablet (100 mg total) by mouth 2 (two) times daily.  Dispense: 20 tablet; Refill: 0 - benzonatate (TESSALON) 200 MG capsule; Take 1 capsule (200 mg total) by mouth 2 (two) times daily as needed for cough.  Dispense: 20 capsule; Refill: 0  2. Laryngitis Onset the past several days with the ticklish cough. No fever, sore throat or PND. If fever starts, may use antibiotic. Given prednisone taper for vocal cord inflammation. Recheck prn. - predniSONE (DELTASONE) 5 MG tablet; Take 1 tablet (5 mg total) by mouth daily with breakfast. Taper down by one tablet daily (6,5,4,3,2,1)  Dispense: 21 tablet; Refill: 0 - doxycycline (VIBRA-TABS) 100 MG tablet; Take 1 tablet (100 mg total) by mouth 2 (two) times daily.  Dispense: 20 tablet; Refill: 0       Dortha Kern, PA   [facility]

## 2017-05-28 ENCOUNTER — Encounter: Payer: Self-pay | Admitting: Family Medicine

## 2017-05-28 ENCOUNTER — Ambulatory Visit (INDEPENDENT_AMBULATORY_CARE_PROVIDER_SITE_OTHER): Payer: BLUE CROSS/BLUE SHIELD | Admitting: Family Medicine

## 2017-05-28 VITALS — BP 102/64 | HR 86 | Temp 98.0°F | Wt 162.8 lb

## 2017-05-28 DIAGNOSIS — R350 Frequency of micturition: Secondary | ICD-10-CM

## 2017-05-28 DIAGNOSIS — M545 Low back pain, unspecified: Secondary | ICD-10-CM

## 2017-05-28 DIAGNOSIS — N3001 Acute cystitis with hematuria: Secondary | ICD-10-CM

## 2017-05-28 LAB — POCT URINALYSIS DIPSTICK
BILIRUBIN UA: NEGATIVE
Glucose, UA: NEGATIVE
Ketones, UA: NEGATIVE
Nitrite, UA: NEGATIVE
PH UA: 7 (ref 5.0–8.0)
Spec Grav, UA: 1.015 (ref 1.010–1.025)
UROBILINOGEN UA: 0.2 U/dL

## 2017-05-28 MED ORDER — LEVOFLOXACIN 500 MG PO TABS: 500.0000 mg | ORAL_TABLET | Freq: Every day | ORAL | 0 refills | Status: DC

## 2017-05-28 NOTE — Patient Instructions (Signed)
Acute Urinary Retention, Female Urinary retention means you are unable to pee completely or at all (empty your bladder). Follow these instructions at home:  Drink enough fluids to keep your pee (urine) clear or pale yellow.  If you are sent home with a tube that drains the bladder (catheter), there will be a drainage bag attached to it. There are two types of bags. One is big that you can wear at night without having to empty it. One is smaller and needs to be emptied more often.  Keep the drainage bag emptied.  Keep the drainage bag lower than the tube.  Only take medicine as told by your doctor. Contact a doctor if:  You have a low-grade fever.  You have spasms or you are leaking pee when you have spasms. Get help right away if:  You have chills or a fever.  Your catheter stops draining pee.  Your catheter falls out.  You have increased bleeding that does not stop after you have rested and increased the amount of fluids you had been drinking. This information is not intended to replace advice given to you by your health care provider. Make sure you discuss any questions you have with your health care provider. Document Released: 08/04/2007 Document Revised: 07/24/2015 Document Reviewed: 07/27/2012 Elsevier Interactive Patient Education  2017 Elsevier Inc.  

## 2017-05-28 NOTE — Progress Notes (Signed)
Patient: Heather Robertson Female    DOB: [birth year]   47 y.o.   MRN: [id number] Visit Date: 05/28/2017  Today's Provider: Mila Merry, MD   Chief Complaint  Patient presents with  . Urinary Tract Infection   Subjective:    Urinary Tract Infection   This is a new problem. Episode onset: this week. The problem occurs intermittently. The quality of the pain is described as burning. The pain is mild. Associated symptoms include frequency. Associated symptoms comments: Low back pain. She has tried NSAIDs for the symptoms. The treatment provided moderate relief.  She does report history of kidney infections requiring hospitalization in the past. She is not currently having any nausea, vomiting, chills, or sweats.     Allergies  Allergen Reactions  . Azithromycin Hives  . Bactrim [Sulfamethoxazole-Trimethoprim] Nausea And Vomiting    dizziness  . Carbapenems   . Cephalosporins   . Morphine And Related Hives  . Penicillins Swelling  . Quaternium-15      Current Outpatient Medications:  .  albuterol (PROVENTIL HFA;VENTOLIN HFA) 108 (90 Base) MCG/ACT inhaler, Inhale 2 puffs into the lungs every 6 (six) hours as needed for wheezing or shortness of breath., Disp: 1 Inhaler, Rfl: 5 .  estrogens, conjugated, (PREMARIN) 0.625 MG tablet, , Disp: , Rfl:  .  fluticasone (FLONASE) 50 MCG/ACT nasal spray, Place 2 sprays into both nostrils daily., Disp: 16 g, Rfl: 2 .  Fluticasone-Salmeterol (ADVAIR DISKUS) 250-50 MCG/DOSE AEPB, Inhale 1 puff into the lungs 2 (two) times daily., Disp: 14 each, Rfl: 0 .  Krill Oil 350 MG CAPS, Take by mouth 2 (two) times daily., Disp: , Rfl:  .  Multiple Vitamins-Calcium (ONE-A-DAY WOMENS PO), Take 1 tablet by mouth daily., Disp: , Rfl:  .  ALPRAZolam (XANAX) 0.25 MG tablet, Take 1 tablet (0.25 mg total) by mouth 2 (two) times daily as needed for anxiety. (Patient not taking: Reported on 05/13/2017), Disp: 30 tablet, Rfl: 0 .  cyclobenzaprine (FLEXERIL) 5 MG  tablet, Take at bedtime as needed for leg cramps. (Patient not taking: Reported on 05/13/2017), Disp: 14 tablet, Rfl: 0 .  simvastatin (ZOCOR) 20 MG tablet, Take 1 tablet (20 mg total) by mouth daily. (Patient not taking: Reported on 10/11/2016), Disp: 30 tablet, Rfl: 3   Review of Systems  Constitutional: Negative.   Respiratory: Negative.   Cardiovascular: Negative.   Gastrointestinal: Positive for abdominal pain.  Genitourinary: Positive for frequency.  Musculoskeletal: Positive for back pain.    Social History   Tobacco Use  . Smoking status: Former Games developer  . Smokeless tobacco: Never Used  Substance Use Topics  . Alcohol use: No    Alcohol/week: 0.0 oz   Objective:   BP 102/64 (BP Location: Right Arm, Patient Position: Sitting, Cuff Size: Normal)   Pulse 86   Temp 98 F (36.7 C) (Oral)   Wt 162 lb 12.8 oz (73.8 kg)   SpO2 98%   BMI 29.78 kg/m    Physical Exam  General appearance: alert, well developed, well nourished, cooperative and in no distress Head: Normocephalic, without obvious abnormality, atraumatic Respiratory: Respirations even and unlabored, normal respiratory rate  Results for orders placed or performed in visit on 05/28/17  POCT Urinalysis Dipstick  Result Value Ref Range   Color, UA yellow    Clarity, UA cloudy    Glucose, UA negative    Bilirubin, UA negative    Ketones, UA negative    Spec Grav, UA 1.015 1.010 -  1.025   Blood, UA nonhemolyzed moderate    pH, UA 7.0 5.0 - 8.0   Protein, UA trace    Urobilinogen, UA 0.2 0.2 or 1.0 E.U./dL   Nitrite, UA negative    Leukocytes, UA Moderate (2+) (A) Negative   Appearance     Odor          Assessment & Plan:     1. Urinary frequency  - POCT Urinalysis Dipstick  2. Low back pain without sciatica, unspecified back pain laterality, unspecified chronicity  - POCT Urinalysis Dipstick  3. Acute cystitis with hematuria  - levofloxacin (LEVAQUIN) 500 MG tablet; Take 1 tablet (500 mg total)  by mouth daily.  Dispense: 7 tablet; Refill: 0 - Urine Culture  Counseled on potential tendon injuries associated with flouroquinolones and to avoid strenuous activities for the next few weeks.       Mila Merryonald Rett Stehlik, MD  [facility]

## 2017-05-30 LAB — URINE CULTURE

## 2017-05-30 LAB — SPECIMEN STATUS REPORT

## 2017-06-02 ENCOUNTER — Encounter: Payer: Self-pay | Admitting: Family Medicine

## 2017-06-03 ENCOUNTER — Encounter: Payer: Self-pay | Admitting: Family Medicine

## 2017-06-03 ENCOUNTER — Ambulatory Visit (INDEPENDENT_AMBULATORY_CARE_PROVIDER_SITE_OTHER): Payer: BLUE CROSS/BLUE SHIELD | Admitting: Family Medicine

## 2017-06-03 VITALS — BP 102/80 | HR 77 | Temp 97.8°F | Resp 16 | Wt 164.0 lb

## 2017-06-03 DIAGNOSIS — N3001 Acute cystitis with hematuria: Secondary | ICD-10-CM | POA: Diagnosis not present

## 2017-06-03 LAB — POCT URINALYSIS DIPSTICK
Bilirubin, UA: NEGATIVE
GLUCOSE UA: NEGATIVE
Ketones, UA: NEGATIVE
LEUKOCYTES UA: NEGATIVE
NITRITE UA: NEGATIVE
PROTEIN UA: NEGATIVE
Spec Grav, UA: 1.01 (ref 1.010–1.025)
Urobilinogen, UA: 0.2 E.U./dL
pH, UA: 6 (ref 5.0–8.0)

## 2017-06-03 NOTE — Progress Notes (Signed)
Patient: Heather Robertson Female    DOB: 10/30/1970   47 y.o.   MRN: [id number] Visit Date: 06/03/2017  Today's Provider: Mila Merryonald Fisher, MD   Chief Complaint  Patient presents with  . Urinary Incontinence   Subjective:    HPI  Acute cystitis with hematuria From 05/28/2017-given levofloxacin (LEVAQUIN) 500 MG tablet. Patient sent message on 06/02/2017 saying she was having urinary incontinence and bladder spasms. Patient was advised to office visit. Today patient comes in reporting that she has been taking the Levaquin as prescribed up until yesterday. Patient says she didn't know if it was helping her symptoms, so she decided not to take any doses yesterday. She is still having lower back pain, urinary incontinence, urgency and bladder spasms.  Allergies  Allergen Reactions  . Azithromycin Hives  . Bactrim [Sulfamethoxazole-Trimethoprim] Nausea And Vomiting    dizziness  . Carbapenems   . Cephalosporins   . Morphine And Related Hives  . Penicillins Swelling  . Quaternium-15      Current Outpatient Medications:  .  albuterol (PROVENTIL HFA;VENTOLIN HFA) 108 (90 Base) MCG/ACT inhaler, Inhale 2 puffs into the lungs every 6 (six) hours as needed for wheezing or shortness of breath., Disp: 1 Inhaler, Rfl: 5 .  ALPRAZolam (XANAX) 0.25 MG tablet, Take 1 tablet (0.25 mg total) by mouth 2 (two) times daily as needed for anxiety., Disp: 30 tablet, Rfl: 0 .  estrogens, conjugated, (PREMARIN) 0.625 MG tablet, , Disp: , Rfl:  .  Krill Oil 350 MG CAPS, Take by mouth 2 (two) times daily., Disp: , Rfl:  .  levofloxacin (LEVAQUIN) 500 MG tablet, Take 1 tablet (500 mg total) by mouth daily., Disp: 7 tablet, Rfl: 0 .  Multiple Vitamins-Calcium (ONE-A-DAY WOMENS PO), Take 1 tablet by mouth daily., Disp: , Rfl:  .  cyclobenzaprine (FLEXERIL) 5 MG tablet, Take at bedtime as needed for leg cramps. (Patient not taking: Reported on 06/03/2017), Disp: 14 tablet, Rfl: 0 .  fluticasone (FLONASE) 50  MCG/ACT nasal spray, Place 2 sprays into both nostrils daily. (Patient not taking: Reported on 06/03/2017), Disp: 16 g, Rfl: 2 .  Fluticasone-Salmeterol (ADVAIR DISKUS) 250-50 MCG/DOSE AEPB, Inhale 1 puff into the lungs 2 (two) times daily. (Patient not taking: Reported on 06/03/2017), Disp: 14 each, Rfl: 0 .  simvastatin (ZOCOR) 20 MG tablet, Take 1 tablet (20 mg total) by mouth daily. (Patient not taking: Reported on 06/03/2017), Disp: 30 tablet, Rfl: 3  Review of Systems  Constitutional: Negative for appetite change, chills, fatigue and fever.  Respiratory: Negative for chest tightness and shortness of breath.   Cardiovascular: Negative for chest pain and palpitations.  Gastrointestinal: Negative for abdominal pain, nausea and vomiting.  Genitourinary: Positive for urgency.       Bladder spasms, urinary incontinence  Musculoskeletal: Positive for back pain (lower back pain).  Neurological: Negative for dizziness and weakness.    Social History   Tobacco Use  . Smoking status: Former Games developermoker  . Smokeless tobacco: Never Used  Substance Use Topics  . Alcohol use: No    Alcohol/week: 0.0 oz   Objective:   BP 102/80 (BP Location: Right Arm, Patient Position: Sitting, Cuff Size: Large)   Pulse 77   Temp 97.8 F (36.6 C) (Oral)   Resp 16   Wt 164 lb (74.4 kg)   SpO2 98% Comment: room air  BMI 30.00 kg/m  There were no vitals filed for this visit.   Results for orders placed or performed  in visit on 06/03/17  POCT Urinalysis Dipstick  Result Value Ref Range   Color, UA yellow    Clarity, UA clear    Glucose, UA negative    Bilirubin, UA negative    Ketones, UA negative    Spec Grav, UA 1.010 1.010 - 1.025   Blood, UA Trace (Non-hemolyzed)    pH, UA 6.0 5.0 - 8.0   Protein, UA negative    Urobilinogen, UA 0.2 0.2 or 1.0 E.U./dL   Nitrite, UA negative    Leukocytes, UA Negative Negative   Appearance     Odor           Assessment & Plan:     1. Acute cystitis with  hematuria Lab visit only to confirm resolution of UTI.  - POCT Urinalysis Dipstick      Mila Merry, MD  [facility]

## 2017-07-26 DIAGNOSIS — S8002XA Contusion of left knee, initial encounter: Secondary | ICD-10-CM | POA: Diagnosis not present

## 2017-09-06 ENCOUNTER — Other Ambulatory Visit: Payer: Self-pay | Admitting: Obstetrics and Gynecology

## 2017-09-06 DIAGNOSIS — Z01411 Encounter for gynecological examination (general) (routine) with abnormal findings: Secondary | ICD-10-CM | POA: Diagnosis not present

## 2017-09-06 DIAGNOSIS — Z1231 Encounter for screening mammogram for malignant neoplasm of breast: Secondary | ICD-10-CM

## 2017-09-06 DIAGNOSIS — L509 Urticaria, unspecified: Secondary | ICD-10-CM | POA: Diagnosis not present

## 2017-09-06 DIAGNOSIS — N898 Other specified noninflammatory disorders of vagina: Secondary | ICD-10-CM | POA: Diagnosis not present

## 2017-09-07 ENCOUNTER — Encounter: Payer: Self-pay | Admitting: Family Medicine

## 2017-09-08 ENCOUNTER — Telehealth: Payer: Self-pay

## 2017-09-08 DIAGNOSIS — L509 Urticaria, unspecified: Secondary | ICD-10-CM

## 2017-09-08 DIAGNOSIS — L299 Pruritus, unspecified: Secondary | ICD-10-CM

## 2017-09-08 NOTE — Telephone Encounter (Signed)
Schedule allergist referral for persistent hives and itching.

## 2017-09-08 NOTE — Telephone Encounter (Signed)
Allergist referral ordered. Patient advised via patient email.

## 2017-09-21 ENCOUNTER — Ambulatory Visit
Admission: RE | Admit: 2017-09-21 | Discharge: 2017-09-21 | Disposition: A | Discharge status: Home / Self Care | Payer: BLUE CROSS/BLUE SHIELD | Source: Ambulatory Visit | Attending: Obstetrics and Gynecology | Admitting: Obstetrics and Gynecology

## 2017-09-21 DIAGNOSIS — Z1231 Encounter for screening mammogram for malignant neoplasm of breast: Secondary | ICD-10-CM | POA: Insufficient documentation

## 2017-11-03 DIAGNOSIS — J309 Allergic rhinitis, unspecified: Secondary | ICD-10-CM | POA: Diagnosis not present

## 2017-11-03 DIAGNOSIS — J300 Vasomotor rhinitis: Secondary | ICD-10-CM | POA: Diagnosis not present

## 2017-11-03 DIAGNOSIS — L501 Idiopathic urticaria: Secondary | ICD-10-CM | POA: Diagnosis not present

## 2017-11-03 DIAGNOSIS — R05 Cough: Secondary | ICD-10-CM | POA: Diagnosis not present

## 2017-12-26 DIAGNOSIS — M25562 Pain in left knee: Secondary | ICD-10-CM | POA: Diagnosis not present

## 2018-01-06 DIAGNOSIS — M25562 Pain in left knee: Secondary | ICD-10-CM | POA: Diagnosis not present

## 2018-01-06 DIAGNOSIS — M222X2 Patellofemoral disorders, left knee: Secondary | ICD-10-CM | POA: Diagnosis not present

## 2018-01-09 DIAGNOSIS — M25562 Pain in left knee: Secondary | ICD-10-CM | POA: Diagnosis not present

## 2018-01-09 DIAGNOSIS — M222X2 Patellofemoral disorders, left knee: Secondary | ICD-10-CM | POA: Diagnosis not present

## 2018-01-13 DIAGNOSIS — M222X2 Patellofemoral disorders, left knee: Secondary | ICD-10-CM | POA: Diagnosis not present

## 2018-01-13 DIAGNOSIS — M25562 Pain in left knee: Secondary | ICD-10-CM | POA: Diagnosis not present

## 2018-01-16 DIAGNOSIS — M222X2 Patellofemoral disorders, left knee: Secondary | ICD-10-CM | POA: Diagnosis not present

## 2018-01-16 DIAGNOSIS — M25562 Pain in left knee: Secondary | ICD-10-CM | POA: Diagnosis not present

## 2018-01-19 DIAGNOSIS — D2271 Melanocytic nevi of right lower limb, including hip: Secondary | ICD-10-CM | POA: Diagnosis not present

## 2018-01-19 DIAGNOSIS — L570 Actinic keratosis: Secondary | ICD-10-CM | POA: Diagnosis not present

## 2018-01-19 DIAGNOSIS — D2262 Melanocytic nevi of left upper limb, including shoulder: Secondary | ICD-10-CM | POA: Diagnosis not present

## 2018-01-19 DIAGNOSIS — D2261 Melanocytic nevi of right upper limb, including shoulder: Secondary | ICD-10-CM | POA: Diagnosis not present

## 2018-01-19 DIAGNOSIS — D225 Melanocytic nevi of trunk: Secondary | ICD-10-CM | POA: Diagnosis not present

## 2018-01-19 DIAGNOSIS — D485 Neoplasm of uncertain behavior of skin: Secondary | ICD-10-CM | POA: Diagnosis not present

## 2018-01-20 DIAGNOSIS — M222X2 Patellofemoral disorders, left knee: Secondary | ICD-10-CM | POA: Diagnosis not present

## 2018-01-20 DIAGNOSIS — M25562 Pain in left knee: Secondary | ICD-10-CM | POA: Diagnosis not present

## 2018-01-24 DIAGNOSIS — M222X2 Patellofemoral disorders, left knee: Secondary | ICD-10-CM | POA: Diagnosis not present

## 2018-01-24 DIAGNOSIS — M25562 Pain in left knee: Secondary | ICD-10-CM | POA: Diagnosis not present

## 2018-01-31 DIAGNOSIS — C44311 Basal cell carcinoma of skin of nose: Secondary | ICD-10-CM | POA: Diagnosis not present

## 2018-01-31 DIAGNOSIS — D485 Neoplasm of uncertain behavior of skin: Secondary | ICD-10-CM | POA: Diagnosis not present

## 2018-04-24 DIAGNOSIS — L578 Other skin changes due to chronic exposure to nonionizing radiation: Secondary | ICD-10-CM | POA: Diagnosis not present

## 2018-04-24 DIAGNOSIS — C44311 Basal cell carcinoma of skin of nose: Secondary | ICD-10-CM | POA: Diagnosis not present

## 2018-04-24 DIAGNOSIS — L814 Other melanin hyperpigmentation: Secondary | ICD-10-CM | POA: Diagnosis not present

## 2018-04-24 DIAGNOSIS — L988 Other specified disorders of the skin and subcutaneous tissue: Secondary | ICD-10-CM | POA: Diagnosis not present

## 2018-04-24 DIAGNOSIS — Z209 Contact with and (suspected) exposure to unspecified communicable disease: Secondary | ICD-10-CM | POA: Diagnosis not present

## 2018-05-09 DIAGNOSIS — J300 Vasomotor rhinitis: Secondary | ICD-10-CM | POA: Diagnosis not present

## 2018-05-09 DIAGNOSIS — L501 Idiopathic urticaria: Secondary | ICD-10-CM | POA: Diagnosis not present

## 2018-05-09 DIAGNOSIS — J4530 Mild persistent asthma, uncomplicated: Secondary | ICD-10-CM | POA: Diagnosis not present

## 2018-05-22 ENCOUNTER — Encounter: Payer: Self-pay | Admitting: Family Medicine

## 2018-07-19 DIAGNOSIS — I208 Other forms of angina pectoris: Secondary | ICD-10-CM | POA: Diagnosis not present

## 2018-09-11 DIAGNOSIS — D2262 Melanocytic nevi of left upper limb, including shoulder: Secondary | ICD-10-CM | POA: Diagnosis not present

## 2018-09-11 DIAGNOSIS — D2271 Melanocytic nevi of right lower limb, including hip: Secondary | ICD-10-CM | POA: Diagnosis not present

## 2018-09-11 DIAGNOSIS — Z85828 Personal history of other malignant neoplasm of skin: Secondary | ICD-10-CM | POA: Diagnosis not present

## 2018-09-11 DIAGNOSIS — D2261 Melanocytic nevi of right upper limb, including shoulder: Secondary | ICD-10-CM | POA: Diagnosis not present

## 2018-10-06 ENCOUNTER — Other Ambulatory Visit: Payer: Self-pay | Admitting: Obstetrics and Gynecology

## 2018-10-06 ENCOUNTER — Ambulatory Visit
Admission: RE | Admit: 2018-10-06 | Discharge: 2018-10-06 | Disposition: A | Discharge status: Home / Self Care | Payer: No Typology Code available for payment source | Source: Ambulatory Visit | Attending: Obstetrics and Gynecology | Admitting: Obstetrics and Gynecology

## 2018-10-06 ENCOUNTER — Other Ambulatory Visit: Payer: Self-pay

## 2018-10-06 DIAGNOSIS — Z1231 Encounter for screening mammogram for malignant neoplasm of breast: Secondary | ICD-10-CM | POA: Insufficient documentation

## 2018-11-24 ENCOUNTER — Ambulatory Visit (INDEPENDENT_AMBULATORY_CARE_PROVIDER_SITE_OTHER): Payer: No Typology Code available for payment source | Admitting: Family Medicine

## 2018-11-24 ENCOUNTER — Encounter: Payer: Self-pay | Admitting: Family Medicine

## 2018-11-24 ENCOUNTER — Other Ambulatory Visit: Payer: Self-pay

## 2018-11-24 VITALS — BP 124/62 | HR 84 | Resp 16 | Ht 62.0 in | Wt 164.0 lb

## 2018-11-24 DIAGNOSIS — Z0000 Encounter for general adult medical examination without abnormal findings: Secondary | ICD-10-CM

## 2018-11-24 DIAGNOSIS — M545 Low back pain, unspecified: Secondary | ICD-10-CM

## 2018-11-24 DIAGNOSIS — N958 Other specified menopausal and perimenopausal disorders: Secondary | ICD-10-CM

## 2018-11-24 DIAGNOSIS — N3281 Overactive bladder: Secondary | ICD-10-CM

## 2018-11-24 DIAGNOSIS — E7800 Pure hypercholesterolemia, unspecified: Secondary | ICD-10-CM | POA: Diagnosis not present

## 2018-11-24 DIAGNOSIS — Z800 Family history of malignant neoplasm of digestive organs: Secondary | ICD-10-CM

## 2018-11-24 DIAGNOSIS — Z23 Encounter for immunization: Secondary | ICD-10-CM | POA: Diagnosis not present

## 2018-11-24 DIAGNOSIS — J45991 Cough variant asthma: Secondary | ICD-10-CM

## 2018-11-24 NOTE — Progress Notes (Signed)
Patient: Heather Robertson, Female    DOB: 07-05-1970, 49 y.o.   MRN: [id number] Visit Date: 11/24/2018  Today's Provider: Vernie Murders, PA   Chief Complaint  Patient presents with  . Annual Exam   Subjective:     Annual physical exam Heather Robertson is a 48 y.o. female who presents today for health maintenance and complete physical. She feels well. She reports exercising not regularly. She reports she is sleeping fairly well.  Mammogram- 10/06/2018. Normal. Repeat 1 year. Has GYN.   Immunization History  Administered Date(s) Administered  . Influenza Split 12/13/2010  . Tdap 12/31/2011   Review of Systems  All other systems reviewed and are negative.  Social History      She  reports that she has quit smoking. She has never used smokeless tobacco. She reports that she does not drink alcohol or use drugs.       Social History   Socioeconomic History  . Marital status: Married    Spouse name: Not on file  . Number of children: Not on file  . Years of education: Not on file  . Highest education level: Not on file  Occupational History  . Not on file  Social Needs  . Financial resource strain: Not on file  . Food insecurity    Worry: Not on file    Inability: Not on file  . Transportation needs    Medical: Not on file    Non-medical: Not on file  Tobacco Use  . Smoking status: Former Research scientist (life sciences)  . Smokeless tobacco: Never Used  Substance and Sexual Activity  . Alcohol use: No    Alcohol/week: 0.0 standard drinks  . Drug use: No  . Sexual activity: Not on file  Lifestyle  . Physical activity    Days per week: Not on file    Minutes per session: Not on file  . Stress: Not on file  Relationships  . Social Herbalist on phone: Not on file    Gets together: Not on file    Attends religious service: Not on file    Active member of club or organization: Not on file    Attends meetings of clubs or organizations: Not on file    Relationship status:  Not on file  Other Topics Concern  . Not on file  Social History Narrative  . Not on file   Patient Active Problem List   Diagnosis Date Noted  . Contusion of knee 10/11/2016  . Pes anserinus bursitis 10/11/2016  . Adjustment disorder with anxiety 06/19/2015  . Cough variant asthma 06/19/2015  . Carpal tunnel syndrome 06/19/2015  . Dizziness 06/19/2015  . Family history of alpha 1 antitrypsin deficiency 06/19/2015  . Feeling stressed out 06/19/2015  . Itch 09/02/2008  . Pure hypercholesterolemia 09/02/2008  . Artificial menopause 09/02/2008   Past Surgical History:  Procedure Laterality Date  . ABDOMINAL HYSTERECTOMY     Family History        Family Status  Relation Name Status  . Mother  Alive  . Father  Alive  . Sister  Alive  . Brother 1 Alive  . MGM  Deceased  . MGF  Deceased       MVA  . PGM  Deceased  . PGF  Deceased  . Brother 2 Alive  . Neg Hx  (Not Specified)        Her family history includes Alpha-1 antitrypsin deficiency in her  brother; Asthma in her sister; CVA in her paternal grandfather; Colon cancer in her father; Diabetes in her paternal grandfather; Heart attack in her paternal grandmother; Hypertension in her paternal grandfather and sister; Leukemia in her maternal grandmother. There is no history of Breast cancer.     Allergies  Allergen Reactions  . Azithromycin Hives  . Bactrim [Sulfamethoxazole-Trimethoprim] Nausea And Vomiting    dizziness  . Carbapenems   . Cephalosporins   . Morphine And Related Hives  . Penicillins Swelling  . Quaternium-15     Current Outpatient Medications:  .  albuterol (PROVENTIL HFA;VENTOLIN HFA) 108 (90 Base) MCG/ACT inhaler, Inhale 2 puffs into the lungs every 6 (six) hours as needed for wheezing or shortness of breath., Disp: 1 Inhaler, Rfl: 5 .  ALPRAZolam (XANAX) 0.25 MG tablet, Take 1 tablet (0.25 mg total) by mouth 2 (two) times daily as needed for anxiety., Disp: 30 tablet, Rfl: 0 .  estrogens,  conjugated, (PREMARIN) 0.625 MG tablet, , Disp: , Rfl:  .  levocetirizine (XYZAL) 5 MG tablet, Take 5 mg by mouth every evening., Disp: , Rfl:  .  montelukast (SINGULAIR) 10 MG tablet, Take 10 mg by mouth at bedtime., Disp: , Rfl:  .  Multiple Vitamins-Calcium (ONE-A-DAY WOMENS PO), Take 1 tablet by mouth daily., Disp: , Rfl:  .  Omega-3 1000 MG CAPS, Take 1 capsule by mouth daily., Disp: , Rfl:  .  Zinc 30 MG CAPS, Take 1 capsule by mouth daily., Disp: , Rfl:  .  cyclobenzaprine (FLEXERIL) 5 MG tablet, Take at bedtime as needed for leg cramps. (Patient not taking: Reported on 06/03/2017), Disp: 14 tablet, Rfl: 0 .  fluticasone (FLONASE) 50 MCG/ACT nasal spray, Place 2 sprays into both nostrils daily. (Patient not taking: Reported on 06/03/2017), Disp: 16 g, Rfl: 2 .  Fluticasone-Salmeterol (ADVAIR DISKUS) 250-50 MCG/DOSE AEPB, Inhale 1 puff into the lungs 2 (two) times daily. (Patient not taking: Reported on 06/03/2017), Disp: 14 each, Rfl: 0 .  Krill Oil 350 MG CAPS, Take by mouth 2 (two) times daily., Disp: , Rfl:  .  levofloxacin (LEVAQUIN) 500 MG tablet, Take 1 tablet (500 mg total) by mouth daily., Disp: 7 tablet, Rfl: 0 .  simvastatin (ZOCOR) 20 MG tablet, Take 1 tablet (20 mg total) by mouth daily. (Patient not taking: Reported on 06/03/2017), Disp: 30 tablet, Rfl: 3   Patient Care Team: Tamsen Roershrismon, Kirstan Fentress E, PA as PCP - General (Family Medicine)   Objective:    Vitals: BP 124/62   Pulse 84   Resp 16   Ht 5\' 2"  (1.575 m)   Wt 164 lb (74.4 kg)   SpO2 98%   BMI 30.00 kg/m    Vitals:   11/24/18 1516  BP: 124/62  Pulse: 84  Resp: 16  SpO2: 98%  Weight: 164 lb (74.4 kg)  Height: 5\' 2"  (1.575 m)    Wt Readings from Last 3 Encounters:  11/24/18 164 lb (74.4 kg)  06/03/17 164 lb (74.4 kg)  05/28/17 162 lb 12.8 oz (73.8 kg)   Physical Exam  Depression Screen PHQ 2/9 Scores 11/24/2018 11/24/2018 07/26/2018 10/11/2016  PHQ - 2 Score 0 0 0 0     Assessment & Plan:     Routine Health  Maintenance and Physical Exam  Exercise Activities and Dietary recommendations Goals   Continues to participate in a "spin class" 3 days a week for exercise. Recommend she continue a low fat diet.     Immunization History  Administered Date(s)  Administered  . Influenza Split 12/13/2010  . Tdap 12/31/2011    Health Maintenance  Topic Date Due  . HIV Screening  03/11/1985  . PAP SMEAR-Modifier  04/23/2018  . INFLUENZA VACCINE  09/30/2018  . TETANUS/TDAP  12/30/2021   Discussed health benefits of physical activity, and encouraged her to engage in regular exercise appropriate for her age and condition.   1. Annual physical exam General health stable. Immunizations in good shape - will give flu shot today. Had benign mammograms on 10-06-18 per GYN. Recheck labs and follow up pending reports. Given anticipatory counseling. - CBC with Differential/Platelet - Comprehensive metabolic panel - Lipid panel - TSH  2. Pure hypercholesterolemia Tolerating the Omega-3 or Krill Oil and trying to follow a low fat diet. Not taking the Simvastatin now. Will recheck CMP and Lipid Panel. Continue diet and exercise. - Comprehensive metabolic panel - Lipid panel  3. Low back pain without sciatica, unspecified back pain laterality, unspecified chronicity Some left low back discomfort intermittently. Some numbness and pins&needles sensation in the left leg when riding in a car too long. Followed at [facility]  4. Needs flu shot - Flu Vaccine QUAD 6+ mos PF IM (Fluarix Quad PF)  5. Overactive bladder Followed by GYN who offered Myrbetriq for frequency and leakage on 10-06-18.  6. Cough variant asthma Intermittent raspy voice and cough. Continues to follow up with allergist prn and takes Singulair with Xyzal for Fall allergies. May use Famotidine 10-20 mg qd prn any GERD component.  7. Artificial menopause Presently on Premarin supplementation with history of TAH/BSO for cysts, fibroids and  endometriosis in 2003. Followed by Dr. Dalbert Garnet (GYN) at [facility] on 10-06-18.  8. Family history of colon cancer in father Father diagnosed with colon cancer 20 years ago and she had an initial colonoscopy April 2010 that was normal. Mother has a history of a recent colonoscopy with 11 polyps. No melena, hematochezia or GI symptoms. Wants to be re-screened with colonoscopy.  - Ambulatory referral to Gastroenterology     Dortha Kern, PA  [facility]

## 2018-11-28 ENCOUNTER — Other Ambulatory Visit: Payer: Self-pay | Admitting: Family Medicine

## 2018-11-28 ENCOUNTER — Telehealth: Payer: Self-pay | Admitting: *Deleted

## 2018-11-28 DIAGNOSIS — F4322 Adjustment disorder with anxiety: Secondary | ICD-10-CM

## 2018-11-28 DIAGNOSIS — R4586 Emotional lability: Secondary | ICD-10-CM

## 2018-11-28 NOTE — Telephone Encounter (Signed)
May use the mood swings of anxiety with depression diagnosis she has to see if it is covered. If insurance doesn't cover, she needs to be aware it would be her responsibility.

## 2018-11-28 NOTE — Telephone Encounter (Signed)
Patient called office requesting we add on a vitamin D lab to her orders. Patient is coming by to get labs drawn today. Please advise?

## 2018-11-28 NOTE — Telephone Encounter (Signed)
Done

## 2018-11-28 NOTE — Telephone Encounter (Signed)
Heather Robertson, I am not sure if insurance will pay for this with routine physical code

## 2018-11-29 LAB — CBC WITH DIFFERENTIAL/PLATELET
Basophils Absolute: 0.1 10*3/uL (ref 0.0–0.2)
Basos: 1 %
EOS (ABSOLUTE): 0.1 10*3/uL (ref 0.0–0.4)
Eos: 2 %
Hematocrit: 39.4 % (ref 34.0–46.6)
Hemoglobin: 13.6 g/dL (ref 11.1–15.9)
Immature Grans (Abs): 0 10*3/uL (ref 0.0–0.1)
Immature Granulocytes: 0 %
Lymphocytes Absolute: 1.6 10*3/uL (ref 0.7–3.1)
Lymphs: 26 %
MCH: 30.2 pg (ref 26.6–33.0)
MCHC: 34.5 g/dL (ref 31.5–35.7)
MCV: 87 fL (ref 79–97)
Monocytes Absolute: 0.6 10*3/uL (ref 0.1–0.9)
Monocytes: 9 %
Neutrophils Absolute: 3.8 10*3/uL (ref 1.4–7.0)
Neutrophils: 62 %
Platelets: 323 10*3/uL (ref 150–450)
RBC: 4.51 x10E6/uL (ref 3.77–5.28)
RDW: 12.5 % (ref 11.7–15.4)
WBC: 6.2 10*3/uL (ref 3.4–10.8)

## 2018-11-29 LAB — COMPREHENSIVE METABOLIC PANEL
ALT: 16 IU/L (ref 0–32)
AST: 20 IU/L (ref 0–40)
Albumin/Globulin Ratio: 1.8 (ref 1.2–2.2)
Albumin: 4.4 g/dL (ref 3.8–4.8)
Alkaline Phosphatase: 85 IU/L (ref 39–117)
BUN/Creatinine Ratio: 15 (ref 9–23)
BUN: 10 mg/dL (ref 6–24)
Bilirubin Total: 0.4 mg/dL (ref 0.0–1.2)
CO2: 21 mmol/L (ref 20–29)
Calcium: 8.9 mg/dL (ref 8.7–10.2)
Chloride: 103 mmol/L (ref 96–106)
Creatinine, Ser: 0.65 mg/dL (ref 0.57–1.00)
GFR calc Af Amer: 121 mL/min/{1.73_m2} (ref >59)
GFR calc non Af Amer: 105 mL/min/{1.73_m2} (ref >59)
Globulin, Total: 2.5 g/dL (ref 1.5–4.5)
Glucose: 79 mg/dL (ref 65–99)
Potassium: 4.2 mmol/L (ref 3.5–5.2)
Sodium: 139 mmol/L (ref 134–144)
Total Protein: 6.9 g/dL (ref 6.0–8.5)

## 2018-11-29 LAB — LIPID PANEL
Chol/HDL Ratio: 5 ratio — ABNORMAL HIGH (ref 0.0–4.4)
Cholesterol, Total: 219 mg/dL — ABNORMAL HIGH (ref 100–199)
HDL: 44 mg/dL (ref >39)
LDL Chol Calc (NIH): 135 mg/dL — ABNORMAL HIGH (ref 0–99)
Triglycerides: 222 mg/dL — ABNORMAL HIGH (ref 0–149)
VLDL Cholesterol Cal: 40 mg/dL (ref 5–40)

## 2018-11-29 LAB — TSH: TSH: 5.07 u[IU]/mL — ABNORMAL HIGH (ref 0.450–4.500)

## 2018-11-29 LAB — SPECIMEN STATUS REPORT

## 2018-11-29 LAB — VITAMIN D 25 HYDROXY (VIT D DEFICIENCY, FRACTURES): Vit D, 25-Hydroxy: 60.4 ng/mL (ref 30.0–100.0)

## 2018-12-19 ENCOUNTER — Encounter: Payer: Self-pay | Admitting: *Deleted

## 2019-10-17 ENCOUNTER — Other Ambulatory Visit: Payer: Self-pay | Admitting: Obstetrics and Gynecology

## 2019-10-17 DIAGNOSIS — Z1231 Encounter for screening mammogram for malignant neoplasm of breast: Secondary | ICD-10-CM

## 2019-11-02 ENCOUNTER — Other Ambulatory Visit: Payer: Self-pay

## 2019-11-02 ENCOUNTER — Ambulatory Visit
Admission: RE | Admit: 2019-11-02 | Discharge: 2019-11-02 | Disposition: A | Discharge status: Home / Self Care | Payer: 59 | Source: Ambulatory Visit | Attending: Obstetrics and Gynecology | Admitting: Obstetrics and Gynecology

## 2019-11-02 DIAGNOSIS — Z1231 Encounter for screening mammogram for malignant neoplasm of breast: Secondary | ICD-10-CM

## 2020-06-25 IMAGING — MG DIGITAL SCREENING BILATERAL MAMMOGRAM WITH TOMO AND CAD
8 series · 8 of 24 positions shown · non-contrast
Comparison: Previous exam(s).

CLINICAL DATA: Screening.

EXAM:
DIGITAL SCREENING BILATERAL MAMMOGRAM WITH TOMO AND CAD

[L MLO synth-2D]
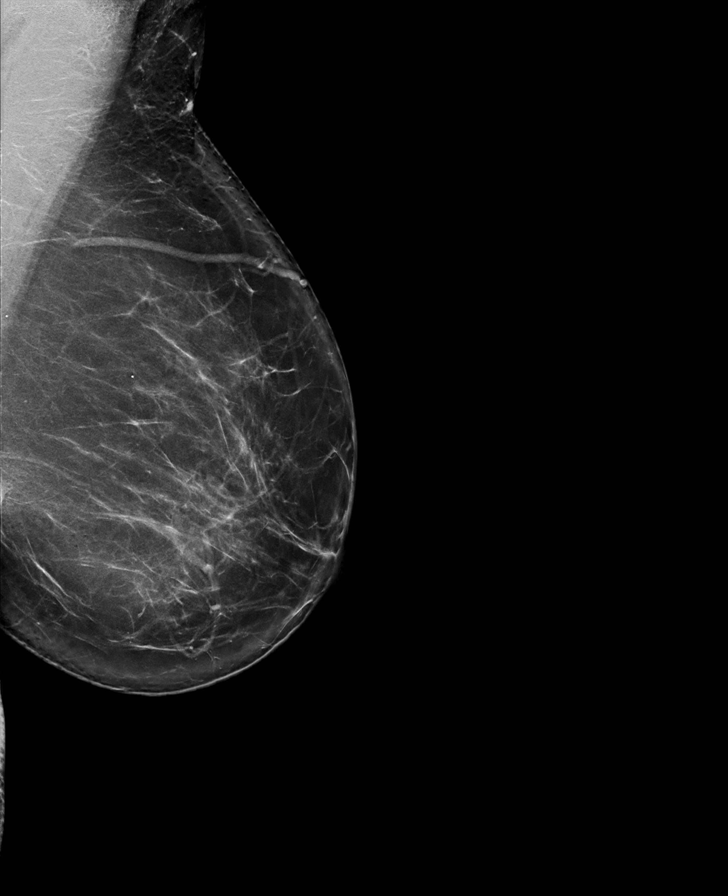

[R MLO synth-2D]
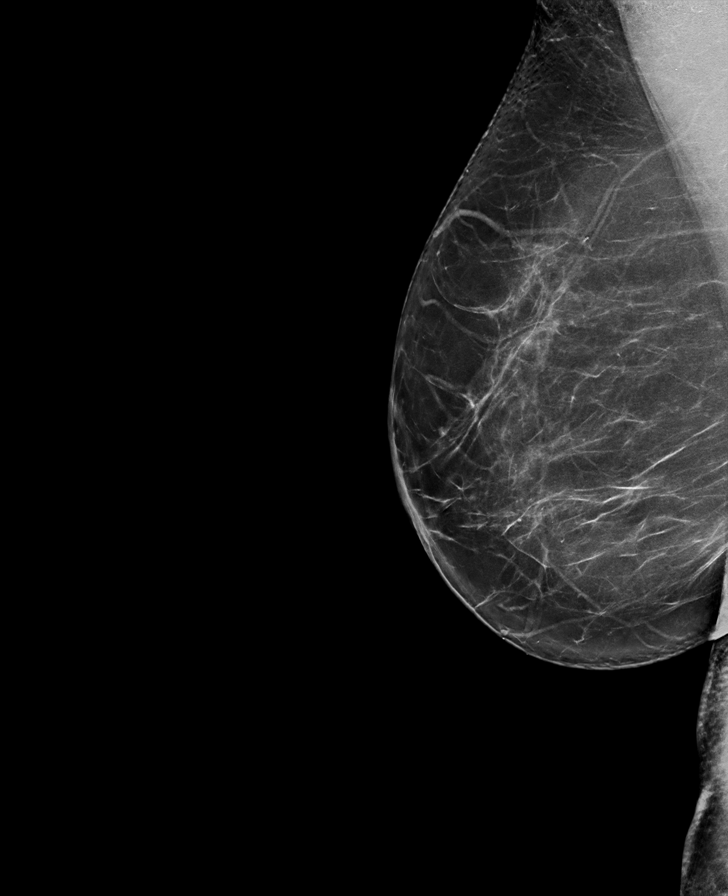

[L CC synth-2D]
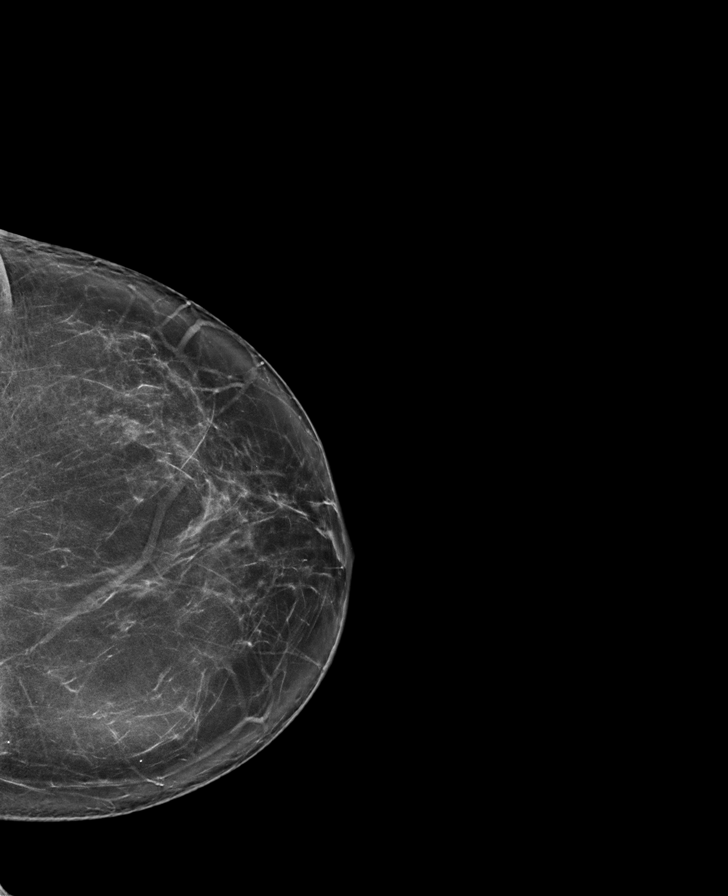

[R CC synth-2D]
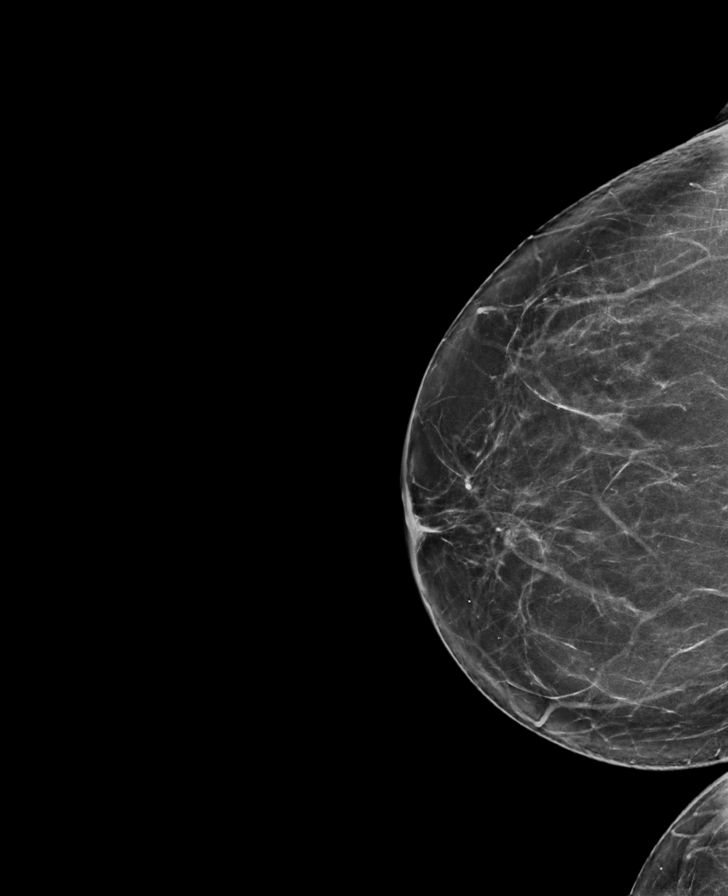

[R CC tomo · tomo slice 37/73.0]
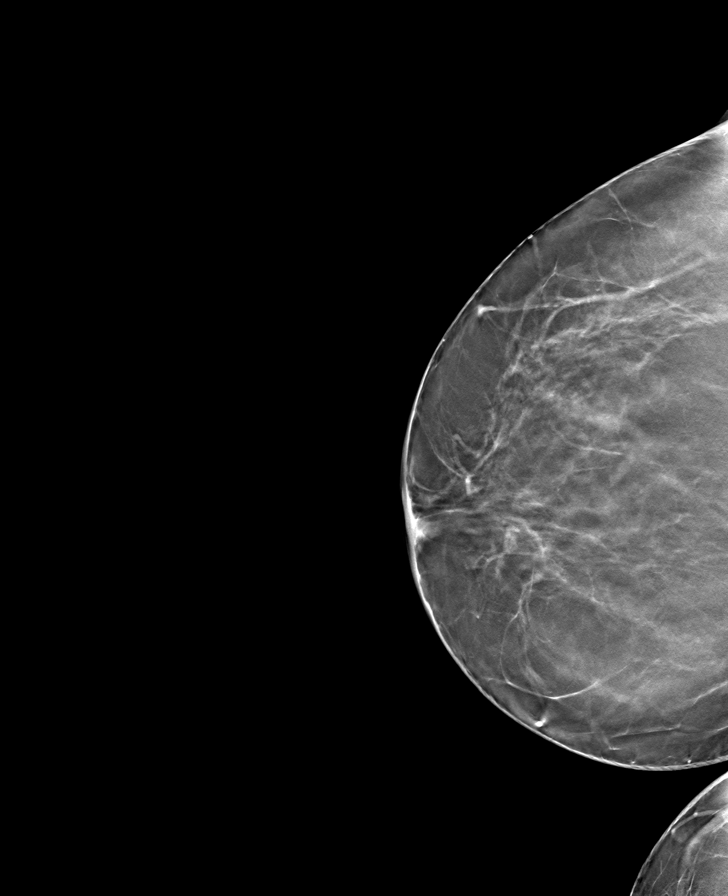

[R MLO tomo · tomo slice 41/80.0]
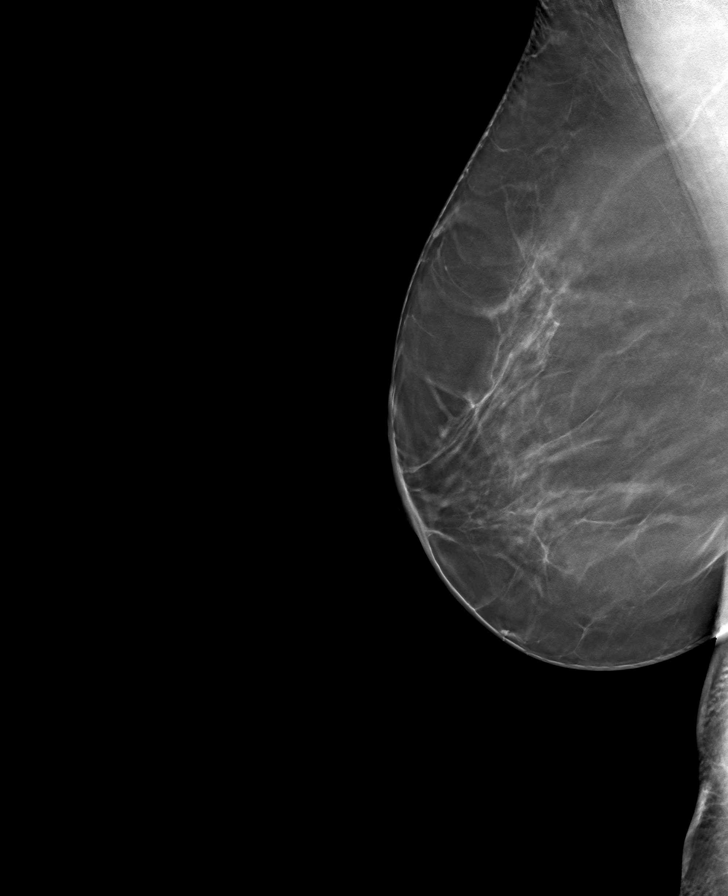

[L MLO tomo · tomo slice 43/85.0]
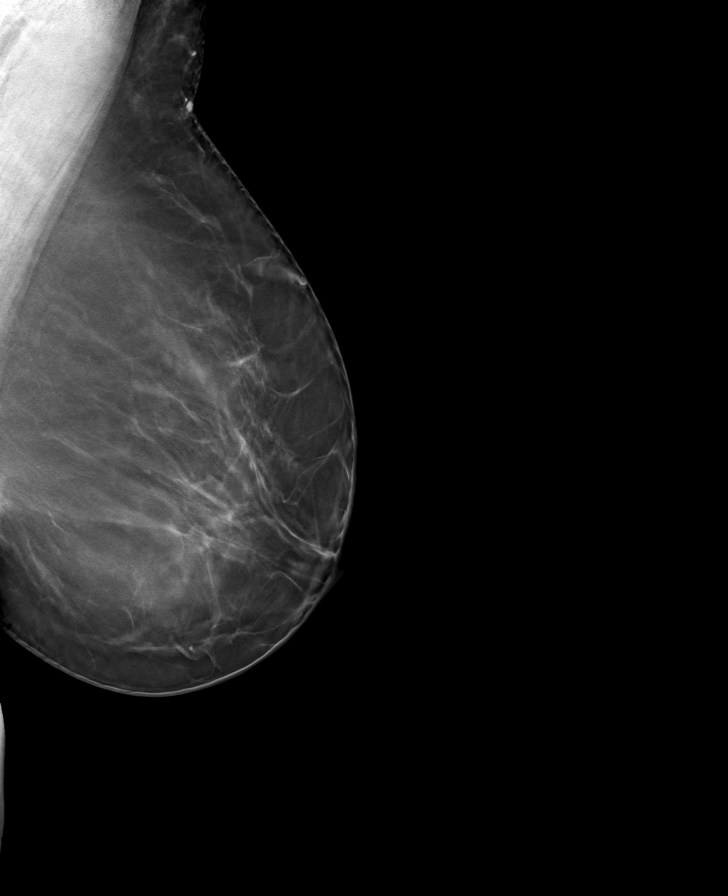

[L CC tomo · tomo slice 39/78.0]
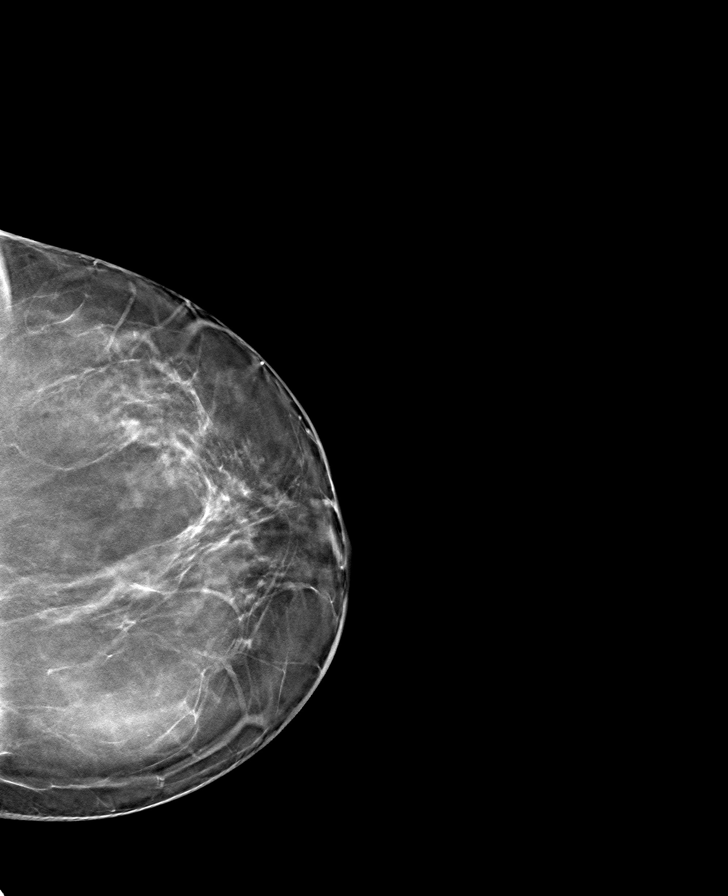

[8 of 24 positions shown; findings below may reference images not displayed]

ACR Breast Density Category b: There are scattered areas of
fibroglandular density.
FINDINGS: There are no findings suspicious for malignancy. Images were
processed with CAD.
IMPRESSION: No mammographic evidence of malignancy. A result letter of this
screening mammogram will be mailed directly to the patient.

RECOMMENDATION:
Screening mammogram in one year. (Code:[id number])

BI-RADS CATEGORY  1: Negative.

## 2020-10-02 ENCOUNTER — Telehealth: Payer: Self-pay

## 2020-10-02 DIAGNOSIS — Z1211 Encounter for screening for malignant neoplasm of colon: Secondary | ICD-10-CM

## 2020-10-02 NOTE — Telephone Encounter (Signed)
Copied from CRM [id number]. Topic: Appointment Scheduling - Scheduling Inquiry for Clinic >> Oct 02, 2020 10:20 AM Traci Sermon wrote: Reason for CRM: Pt called in about wanting to get a colonoscopy scheduled, and wanted to be reached out to see about getting that scheduled. Pt also states she wanted to mention to PCP that she feels her anxiety may be getting worse. Please advise.

## 2020-10-02 NOTE — Telephone Encounter (Signed)
Schedule for screening colonoscopy. Can offer Buspar 10 mg BID #60 for anxiety and schedule work-in appointment 10-21-20.

## 2020-10-02 NOTE — Telephone Encounter (Signed)
Patient has a f/u appt with you on 11/25/20. She feels that her anxiety recommended that she call and schedule an earlier appt if she can not wait to discuss then. Ok to order colonoscopy? Please advise.

## 2020-10-03 NOTE — Telephone Encounter (Signed)
Referral for colonoscopy placed. ?

## 2020-10-03 NOTE — Telephone Encounter (Signed)
Patient called back and explained the recommendation from [place].  Patient agreed to screening colonoscopy said Monday's are best as she doesn't keep her grandchildren.    Patient also stated she wants to try to start to exercise and try other methods prior to taking medication for Anxiety.  However we did schedule a check in at the end of the month to see how things were going and make sure she was on the right track.

## 2020-10-03 NOTE — Telephone Encounter (Signed)
Left message to call back  

## 2020-10-13 ENCOUNTER — Encounter: Payer: No Typology Code available for payment source | Admitting: Family Medicine

## 2020-10-21 ENCOUNTER — Telehealth: Payer: Self-pay

## 2020-10-21 NOTE — Telephone Encounter (Signed)
Called patient no answer left voice,mail for a call back

## 2020-10-27 ENCOUNTER — Ambulatory Visit (INDEPENDENT_AMBULATORY_CARE_PROVIDER_SITE_OTHER): Payer: 59 | Admitting: Family Medicine

## 2020-10-27 ENCOUNTER — Other Ambulatory Visit: Payer: Self-pay

## 2020-10-27 ENCOUNTER — Encounter: Payer: Self-pay | Admitting: Family Medicine

## 2020-10-27 VITALS — BP 125/56 | HR 77 | Temp 98.6°F | Wt 174.0 lb

## 2020-10-27 DIAGNOSIS — Z8639 Personal history of other endocrine, nutritional and metabolic disease: Secondary | ICD-10-CM | POA: Diagnosis not present

## 2020-10-27 DIAGNOSIS — F4322 Adjustment disorder with anxiety: Secondary | ICD-10-CM

## 2020-10-27 DIAGNOSIS — E7800 Pure hypercholesterolemia, unspecified: Secondary | ICD-10-CM

## 2020-10-27 DIAGNOSIS — Z1211 Encounter for screening for malignant neoplasm of colon: Secondary | ICD-10-CM

## 2020-10-27 NOTE — Progress Notes (Signed)
Established patient visit   Patient: Heather Robertson   DOB: [birth year]   50 y.o. Female  MRN: [id number] Visit Date: 10/27/2020  Today's healthcare provider: Dortha Kern, PA-C   No chief complaint on file.  Subjective  -------------------------------------------------------------------------------------------------------------------- HPI  Anxiety, Follow-up  She was last seen for anxiety 2 years ago. Changes made at last visit include none. She feels her anxiety is mild and Improved since last visit.  Patient had been under increased stress due to husband's health.  He is doing better and she feels her anxiety has gotten better.   She does not want to be on any medication for it if she can prevent that.   Symptoms: No chest pain No difficulty concentrating  No dizziness No fatigue  No feelings of losing control No insomnia  No irritable No palpitations  No panic attacks No racing thoughts  No shortness of breath No sweating  No tremors/shakes    GAD-7 Results No flowsheet data found.  PHQ-9 Scores PHQ9 SCORE ONLY 10/27/2020 11/24/2018 11/24/2018  PHQ-9 Total Score 2 0 0    ---------------------------------------------------------------------------------------------------  No past medical history on file. Past Surgical History:  Procedure Laterality Date   ABDOMINAL HYSTERECTOMY     Family History  Problem Relation Age of Onset   Colon cancer Father    Asthma Sister    Hypertension Sister    Alpha-1 antitrypsin deficiency Brother    Leukemia Maternal Grandmother    Heart attack Paternal Grandmother    Hypertension Paternal Grandfather    Diabetes Paternal Grandfather    CVA Paternal Grandfather    Breast cancer Neg Hx    Social History   Tobacco Use   Smoking status: Former   Smokeless tobacco: Never  Substance Use Topics   Alcohol use: No    Alcohol/week: 0.0 standard drinks   Drug use: No   Allergies  Allergen Reactions   Azithromycin  Hives   Bactrim [Sulfamethoxazole-Trimethoprim] Nausea And Vomiting    dizziness   Carbapenems    Cephalosporins    Morphine And Related Hives   Penicillins Swelling   Quaternium-15    Medications: Outpatient Medications Prior to Visit  Medication Sig   Krill Oil 350 MG CAPS Take by mouth 2 (two) times daily.   montelukast (SINGULAIR) 10 MG tablet Take 10 mg by mouth at bedtime.   Multiple Vitamins-Calcium (ONE-A-DAY WOMENS PO) Take 1 tablet by mouth daily.   Omega-3 1000 MG CAPS Take 1 capsule by mouth daily.   Zinc 30 MG CAPS Take 1 capsule by mouth daily.   [DISCONTINUED] albuterol (PROVENTIL HFA;VENTOLIN HFA) 108 (90 Base) MCG/ACT inhaler Inhale 2 puffs into the lungs every 6 (six) hours as needed for wheezing or shortness of breath.   [DISCONTINUED] estrogens, conjugated, (PREMARIN) 0.625 MG tablet    [DISCONTINUED] levocetirizine (XYZAL) 5 MG tablet Take 5 mg by mouth every evening.   No facility-administered medications prior to visit.    Review of Systems      Objective  -------------------------------------------------------------------------------------------------------------------- BP (!) 125/56 (BP Location: Right Arm, Patient Position: Sitting, Cuff Size: Normal)   Pulse 77   Temp 98.6 F (37 C) (Oral)   Wt 174 lb (78.9 kg)   SpO2 100%   BMI 31.83 kg/m   Physical Exam Constitutional:      Appearance: She is well-developed.  HENT:     Head: Normocephalic and atraumatic.     Right Ear: External ear normal.     Left Ear:  External ear normal.     Nose: Nose normal.  Eyes:     General:        Right eye: No discharge.     Conjunctiva/sclera: Conjunctivae normal.     Pupils: Pupils are equal, round, and reactive to light.  Neck:     Thyroid: No thyromegaly.     Trachea: No tracheal deviation.  Cardiovascular:     Rate and Rhythm: Normal rate and regular rhythm.     Heart sounds: Normal heart sounds. No murmur heard. Pulmonary:     Effort: Pulmonary  effort is normal. No respiratory distress.     Breath sounds: Normal breath sounds. No wheezing or rales.  Chest:     Chest wall: No tenderness.  Abdominal:     General: There is no distension.     Palpations: Abdomen is soft. There is no mass.     Tenderness: There is no abdominal tenderness. There is no guarding or rebound.  Genitourinary:    Comments: Sees  GYN (Dr. Dalbert Garnet) for PAP and exam. Musculoskeletal:        General: No tenderness. Normal range of motion.     Cervical back: Normal range of motion and neck supple.  Lymphadenopathy:     Cervical: No cervical adenopathy.  Skin:    General: Skin is warm and dry.     Findings: No erythema or rash.  Neurological:     Mental Status: She is alert and oriented to person, place, and time.     Cranial Nerves: No cranial nerve deficit.     Motor: No abnormal muscle tone.     Coordination: Coordination normal.     Deep Tendon Reflexes: Reflexes are normal and symmetric. Reflexes normal.  Psychiatric:        Behavior: Behavior normal.        Thought Content: Thought content normal.        Judgment: Judgment normal.      No results found for any visits on 10/27/20.  Assessment & Plan  ---------------------------------------------------------------------------------------------------------------------- 1. Adjustment disorder with anxiety Anxiety with husband's problems with complications of colonoscopy with bleeding. Has begun spin class and feeling better. Counseled regarding need for good nutrition, exercise, sleep and counseling. Does not want to get any medication started, yet. Check routine labs. - CBC with Differential/Platelet - Comprehensive metabolic panel - TSH  2. Pure hypercholesterolemia Exercising more recently. Has been on Omega-3 and Co-q 10 daily. Recheck labs. - Comprehensive metabolic panel - Lipid panel - TSH  3. Colon cancer screening Prefers to see Dr. Servando Snare since sister had a good experience with him.  Mother had 11 benign polyps. - Ambulatory referral to Gastroenterology  4. History of vitamin D deficiency Some fatigue with anxiety reactions. Has restarted Vitamin D supplement. Recheck labs. - CBC with Differential/Platelet - VITAMIN D 25 Hydroxy (Vit-D Deficiency, Fractures)   No follow-ups on file.      I, Teoman Giraud, PA-C, have reviewed all documentation for this visit. The documentation on 10/27/20 for the exam, diagnosis, procedures, and orders are all accurate and complete.    Dortha Kern, PA-C  [employer] [phone] (phone) [phone] (fax)  [facility]

## 2020-10-28 LAB — COMPREHENSIVE METABOLIC PANEL
ALT: 15 IU/L (ref 0–32)
AST: 16 IU/L (ref 0–40)
Albumin/Globulin Ratio: 2 (ref 1.2–2.2)
Albumin: 4.9 g/dL — ABNORMAL HIGH (ref 3.8–4.8)
Alkaline Phosphatase: 73 IU/L (ref 44–121)
BUN/Creatinine Ratio: 15 (ref 9–23)
BUN: 10 mg/dL (ref 6–24)
Bilirubin Total: 0.3 mg/dL (ref 0.0–1.2)
CO2: 22 mmol/L (ref 20–29)
Calcium: 9.7 mg/dL (ref 8.7–10.2)
Chloride: 103 mmol/L (ref 96–106)
Creatinine, Ser: 0.68 mg/dL (ref 0.57–1.00)
Globulin, Total: 2.4 g/dL (ref 1.5–4.5)
Glucose: 93 mg/dL (ref 65–99)
Potassium: 4.5 mmol/L (ref 3.5–5.2)
Sodium: 143 mmol/L (ref 134–144)
Total Protein: 7.3 g/dL (ref 6.0–8.5)
eGFR: 106 mL/min/{1.73_m2} (ref >59)

## 2020-10-28 LAB — CBC WITH DIFFERENTIAL/PLATELET
Basophils Absolute: 0.1 10*3/uL (ref 0.0–0.2)
Basos: 1 %
EOS (ABSOLUTE): 0.3 10*3/uL (ref 0.0–0.4)
Eos: 4 %
Hematocrit: 40.8 % (ref 34.0–46.6)
Hemoglobin: 13.8 g/dL (ref 11.1–15.9)
Immature Grans (Abs): 0 10*3/uL (ref 0.0–0.1)
Immature Granulocytes: 0 %
Lymphocytes Absolute: 1.6 10*3/uL (ref 0.7–3.1)
Lymphs: 26 %
MCH: 30.1 pg (ref 26.6–33.0)
MCHC: 33.8 g/dL (ref 31.5–35.7)
MCV: 89 fL (ref 79–97)
Monocytes Absolute: 0.6 10*3/uL (ref 0.1–0.9)
Monocytes: 10 %
Neutrophils Absolute: 3.7 10*3/uL (ref 1.4–7.0)
Neutrophils: 59 %
Platelets: 310 10*3/uL (ref 150–450)
RBC: 4.59 x10E6/uL (ref 3.77–5.28)
RDW: 13.1 % (ref 11.7–15.4)
WBC: 6.2 10*3/uL (ref 3.4–10.8)

## 2020-10-28 LAB — LIPID PANEL
Chol/HDL Ratio: 6.2 ratio — ABNORMAL HIGH (ref 0.0–4.4)
Cholesterol, Total: 241 mg/dL — ABNORMAL HIGH (ref 100–199)
HDL: 39 mg/dL — ABNORMAL LOW (ref >39)
LDL Chol Calc (NIH): 176 mg/dL — ABNORMAL HIGH (ref 0–99)
Triglycerides: 142 mg/dL (ref 0–149)
VLDL Cholesterol Cal: 26 mg/dL (ref 5–40)

## 2020-10-28 LAB — VITAMIN D 25 HYDROXY (VIT D DEFICIENCY, FRACTURES): Vit D, 25-Hydroxy: 44.5 ng/mL (ref 30.0–100.0)

## 2020-10-28 LAB — TSH: TSH: 2.19 u[IU]/mL (ref 0.450–4.500)

## 2020-10-29 ENCOUNTER — Telehealth: Payer: Self-pay

## 2020-10-29 ENCOUNTER — Other Ambulatory Visit: Payer: Self-pay

## 2020-10-29 DIAGNOSIS — Z800 Family history of malignant neoplasm of digestive organs: Secondary | ICD-10-CM

## 2020-10-29 DIAGNOSIS — Z1211 Encounter for screening for malignant neoplasm of colon: Secondary | ICD-10-CM

## 2020-10-29 MED ORDER — SUTAB 1479-225-188 MG PO TABS: ORAL_TABLET | ORAL | 0 refills | Status: DC

## 2020-10-29 NOTE — Telephone Encounter (Signed)
Gastroenterology Pre-Procedure Review  Request Date: 11/17/2020 Requesting Physician: Dr. Servando Snare   PATIENT REVIEW QUESTIONS: The patient responded to the following health history questions as indicated:    1. Are you having any GI issues? no 2. Do you have a personal history of Polyps? no 3. Do you have a family history of Colon Cancer or Polyps? Dad had colon cancer and mother had colon polyps  4. Diabetes Mellitus? no 5. Joint replacements in the past 12 months?no 6. Major health problems in the past 3 months?no 7. Any artificial heart valves, MVP, or defibrillator?no    MEDICATIONS & ALLERGIES:    Patient reports the following regarding taking any anticoagulation/antiplatelet therapy:   Plavix, Coumadin, Eliquis, Xarelto, Lovenox, Pradaxa, Brilinta, or Effient? no Aspirin? no  Patient confirms/reports the following medications:  Current Outpatient Medications  Medication Sig Dispense Refill   Krill Oil 350 MG CAPS Take by mouth 2 (two) times daily.     montelukast (SINGULAIR) 10 MG tablet Take 10 mg by mouth at bedtime.     Multiple Vitamins-Calcium (ONE-A-DAY WOMENS PO) Take 1 tablet by mouth daily.     Omega-3 1000 MG CAPS Take 1 capsule by mouth daily.     Zinc 30 MG CAPS Take 1 capsule by mouth daily.     No current facility-administered medications for this visit.    Patient confirms/reports the following allergies:  Allergies  Allergen Reactions   Azithromycin Hives   Bactrim [Sulfamethoxazole-Trimethoprim] Nausea And Vomiting    dizziness   Carbapenems    Cephalosporins    Morphine And Related Hives   Penicillins Swelling   Quaternium-15     No orders of the defined types were placed in this encounter.   AUTHORIZATION INFORMATION Primary Insurance: 1D#: Group #:  Secondary Insurance: 1D#: Group #:  SCHEDULE INFORMATION: Date:  Time: Location:

## 2020-11-05 ENCOUNTER — Other Ambulatory Visit: Payer: Self-pay | Admitting: Family Medicine

## 2020-11-05 ENCOUNTER — Encounter: Payer: Self-pay | Admitting: Gastroenterology

## 2020-11-05 ENCOUNTER — Encounter: Payer: Self-pay | Admitting: Family Medicine

## 2020-11-05 DIAGNOSIS — Z8639 Personal history of other endocrine, nutritional and metabolic disease: Secondary | ICD-10-CM

## 2020-11-05 DIAGNOSIS — E7800 Pure hypercholesterolemia, unspecified: Secondary | ICD-10-CM

## 2020-11-17 ENCOUNTER — Ambulatory Visit: Payer: 59 | Admitting: Anesthesiology

## 2020-11-17 ENCOUNTER — Encounter: Payer: Self-pay | Admitting: Gastroenterology

## 2020-11-17 ENCOUNTER — Ambulatory Visit
Admission: RE | Admit: 2020-11-17 | Discharge: 2020-11-17 | Disposition: A | Discharge status: Home / Self Care | Payer: 59 | Attending: Gastroenterology | Admitting: Gastroenterology

## 2020-11-17 ENCOUNTER — Other Ambulatory Visit: Payer: Self-pay

## 2020-11-17 ENCOUNTER — Encounter
Admission: RE | Disposition: A | Discharge status: Home / Self Care | Payer: Self-pay | Source: Home / Self Care | Attending: Gastroenterology

## 2020-11-17 DIAGNOSIS — Z880 Allergy status to penicillin: Secondary | ICD-10-CM | POA: Insufficient documentation

## 2020-11-17 DIAGNOSIS — Z885 Allergy status to narcotic agent status: Secondary | ICD-10-CM | POA: Diagnosis not present

## 2020-11-17 DIAGNOSIS — K635 Polyp of colon: Secondary | ICD-10-CM | POA: Insufficient documentation

## 2020-11-17 DIAGNOSIS — Z888 Allergy status to other drugs, medicaments and biological substances status: Secondary | ICD-10-CM | POA: Insufficient documentation

## 2020-11-17 DIAGNOSIS — Z791 Long term (current) use of non-steroidal anti-inflammatories (NSAID): Secondary | ICD-10-CM | POA: Diagnosis not present

## 2020-11-17 DIAGNOSIS — K648 Other hemorrhoids: Secondary | ICD-10-CM | POA: Diagnosis not present

## 2020-11-17 DIAGNOSIS — Z87891 Personal history of nicotine dependence: Secondary | ICD-10-CM | POA: Diagnosis not present

## 2020-11-17 DIAGNOSIS — Z90710 Acquired absence of both cervix and uterus: Secondary | ICD-10-CM | POA: Diagnosis not present

## 2020-11-17 DIAGNOSIS — Z806 Family history of leukemia: Secondary | ICD-10-CM | POA: Diagnosis not present

## 2020-11-17 DIAGNOSIS — Z800 Family history of malignant neoplasm of digestive organs: Secondary | ICD-10-CM

## 2020-11-17 DIAGNOSIS — Z881 Allergy status to other antibiotic agents status: Secondary | ICD-10-CM | POA: Diagnosis not present

## 2020-11-17 DIAGNOSIS — Z1211 Encounter for screening for malignant neoplasm of colon: Secondary | ICD-10-CM

## 2020-11-17 HISTORY — DX: Other specified health status: Z78.9

## 2020-11-17 HISTORY — PX: POLYPECTOMY: SHX5525

## 2020-11-17 HISTORY — PX: COLONOSCOPY WITH PROPOFOL: SHX5780

## 2020-11-17 SURGERY — COLONOSCOPY WITH PROPOFOL
Anesthesia: General | Site: Rectum

## 2020-11-17 MED ORDER — SODIUM CHLORIDE 0.9 % IV SOLN: INTRAVENOUS | Status: DC

## 2020-11-17 MED ORDER — ONDANSETRON HCL 4 MG/2ML IJ SOLN: 4.0000 mg | Freq: Once | INTRAMUSCULAR | Status: DC | PRN

## 2020-11-17 MED ORDER — LIDOCAINE HCL (CARDIAC) PF 100 MG/5ML IV SOSY
PREFILLED_SYRINGE | INTRAVENOUS | Status: DC | PRN
  Administered 2020-11-17: 60 mg via INTRAVENOUS

## 2020-11-17 MED ORDER — LACTATED RINGERS IV SOLN: INTRAVENOUS | Status: DC

## 2020-11-17 MED ORDER — ACETAMINOPHEN 160 MG/5ML PO SOLN: 325.0000 mg | ORAL | Status: DC | PRN

## 2020-11-17 MED ORDER — ACETAMINOPHEN 325 MG PO TABS: 325.0000 mg | ORAL_TABLET | ORAL | Status: DC | PRN

## 2020-11-17 MED ORDER — STERILE WATER FOR IRRIGATION IR SOLN
Status: DC | PRN
  Administered 2020-11-17: 150 mL

## 2020-11-17 MED ORDER — PROPOFOL 10 MG/ML IV BOLUS
INTRAVENOUS | Status: DC | PRN
  Administered 2020-11-17: 30 mg via INTRAVENOUS
  Administered 2020-11-17: 50 mg via INTRAVENOUS
  Administered 2020-11-17: 70 mg via INTRAVENOUS
  Administered 2020-11-17 (×2): 30 mg via INTRAVENOUS

## 2020-11-17 SURGICAL SUPPLY — 7 items
FORCEPS BIOP RAD 4 LRG CAP 4 (CUTTING FORCEPS) ×2 IMPLANT
GOWN CVR UNV OPN BCK APRN NK (MISCELLANEOUS) ×2 IMPLANT
GOWN ISOL THUMB LOOP REG UNIV (MISCELLANEOUS) ×4
KIT PRC NS LF DISP ENDO (KITS) ×1 IMPLANT
KIT PROCEDURE OLYMPUS (KITS) ×2
MANIFOLD NEPTUNE II (INSTRUMENTS) ×2 IMPLANT
WATER STERILE IRR 250ML POUR (IV SOLUTION) ×2 IMPLANT

## 2020-11-17 NOTE — Op Note (Signed)
[facility] Gastroenterology Patient Name: Heather Robertson Procedure Date: 11/17/2020 10:49 AM MRN: [id number] Account #: [id number] Date of Birth: [birth year] Admit Type: Outpatient Age: 50 Room: [facility] OR ROOM 01 Gender: Female Note Status: Finalized Instrument Name: [id number] Procedure:             Colonoscopy Indications:           Screening for colon cancer: Family history of                         colorectal cancer in distant relative(s) before age 11 Providers:             Midge Minium MD, MD Referring MD:          Jodell Cipro. Chrismon, MD (Referring MD) Medicines:             Propofol per Anesthesia Complications:         No immediate complications. Procedure:             Pre-Anesthesia Assessment:                        - Prior to the procedure, a History and Physical was                         performed, and patient medications and allergies were                         reviewed. The patient's tolerance of previous                         anesthesia was also reviewed. The risks and benefits                         of the procedure and the sedation options and risks                         were discussed with the patient. All questions were                         answered, and informed consent was obtained. Prior                         Anticoagulants: The patient has taken no previous                         anticoagulant or antiplatelet agents. ASA Grade                         Assessment: II - A patient with mild systemic disease.                         After reviewing the risks and benefits, the patient                         was deemed in satisfactory condition to undergo the                         procedure.  After obtaining informed consent, the colonoscope was                         passed under direct vision. Throughout the procedure,                         the patient's blood pressure, pulse, and oxygen                          saturations were monitored continuously. The                         Colonoscope was introduced through the anus and                         advanced to the the cecum, identified by appendiceal                         orifice and ileocecal valve. The colonoscopy was                         performed without difficulty. The patient tolerated                         the procedure well. The quality of the bowel                         preparation was excellent. Findings:      The perianal and digital rectal examinations were normal.      A 2 mm polyp was found in the descending colon. The polyp was sessile.       The polyp was removed with a cold biopsy forceps. Resection and       retrieval were complete.      Non-bleeding internal hemorrhoids were found during retroflexion. The       hemorrhoids were Grade I (internal hemorrhoids that do not prolapse). Impression:            - One 2 mm polyp in the descending colon, removed with                         a cold biopsy forceps. Resected and retrieved.                        - Non-bleeding internal hemorrhoids. Recommendation:        - Discharge patient to home.                        - Resume previous diet.                        - Continue present medications.                        - Await pathology results.                        - Repeat colonoscopy in 5 years for surveillance. Procedure Code(s):     --- Professional ---  12820, Colonoscopy, flexible; with biopsy, single or                         multiple Diagnosis Code(s):     --- Professional ---                        Z12.11, Encounter for screening for malignant neoplasm                         of colon                        Z80.0, Family history of malignant neoplasm of                         digestive organs                        K63.5, Polyp of colon CPT copyright 2019 American Medical Association. All rights reserved. The codes documented in this  report are preliminary and upon coder review may  be revised to meet current compliance requirements. Midge Minium MD, MD 11/17/2020 11:08:46 AM This report has been signed electronically. Number of Addenda: 0 Note Initiated On: 11/17/2020 10:49 AM Scope Withdrawal Time: 0 hours 6 minutes 55 seconds  Total Procedure Duration: 0 hours 10 minutes 24 seconds  Estimated Blood Loss:  Estimated blood loss: none.      [facility]

## 2020-11-17 NOTE — Anesthesia Preprocedure Evaluation (Signed)
Anesthesia Evaluation  Patient identified by MRN, date of birth, ID band Patient awake    Reviewed: Allergy & Precautions, NPO status   Airway Mallampati: II  TM Distance: >3 FB     Dental   Pulmonary asthma , former smoker,    Pulmonary exam normal        Cardiovascular negative cardio ROS   Rhythm:Regular Rate:Normal     Neuro/Psych    GI/Hepatic   Endo/Other  BMI 30  Renal/GU      Musculoskeletal  (+) Arthritis ,   Abdominal   Peds  Hematology   Anesthesia Other Findings   Reproductive/Obstetrics                             Anesthesia Physical Anesthesia Plan  ASA: 2  Anesthesia Plan: General   Post-op Pain Management:    Induction: Intravenous  PONV Risk Score and Plan: Propofol infusion, TIVA and Treatment may vary due to age or medical condition  Airway Management Planned: Natural Airway and Nasal Cannula  Additional Equipment:   Intra-op Plan:   Post-operative Plan:   Informed Consent: I have reviewed the patients History and Physical, chart, labs and discussed the procedure including the risks, benefits and alternatives for the proposed anesthesia with the patient or authorized representative who has indicated his/her understanding and acceptance.       Plan Discussed with: CRNA  Anesthesia Plan Comments:         Anesthesia Quick Evaluation

## 2020-11-17 NOTE — Anesthesia Postprocedure Evaluation (Signed)
Anesthesia Post Note  Patient: Heather Robertson  Procedure(s) Performed: COLONOSCOPY WITH PROPOFOL (Rectum) POLYPECTOMY (Rectum)     Patient location during evaluation: PACU Anesthesia Type: General Level of consciousness: awake Pain management: pain level controlled Vital Signs Assessment: post-procedure vital signs reviewed and stable Respiratory status: respiratory function stable Cardiovascular status: stable Postop Assessment: no signs of nausea or vomiting Anesthetic complications: no   No notable events documented.  Jola Babinski

## 2020-11-17 NOTE — H&P (Signed)
Midge Minium, MD [facility] [address] [place], [place] [address] Phone:[phone] Fax : [phone]  Primary Care Physician:  Tamsen Roers, PA-C Primary Gastroenterologist:  Dr. Servando Snare  Pre-Procedure History & Physical: HPI:  Heather Robertson is a 50 y.o. female is here for an colonoscopy.   Past Medical History:  Diagnosis Date   Medical history non-contributory     Past Surgical History:  Procedure Laterality Date   ABDOMINAL HYSTERECTOMY      Prior to Admission medications   Medication Sig Start Date End Date Taking? Authorizing Provider  Ascorbic Acid (VITAMIN C PO) Take by mouth.   Yes [provider]  B Complex Vitamins (VITAMIN-B COMPLEX PO) Take by mouth.   Yes [provider]  Cholecalciferol (VITAMIN D3 PO) Take by mouth.   Yes [provider]  Coenzyme Q10 (CO Q-10 PO) Take by mouth.   Yes [provider]  Flaxseed, Linseed, (FLAX SEED OIL PO) Take by mouth.   Yes [provider]  MAGNESIUM PO Take by mouth.   Yes [provider]  meloxicam (MOBIC) 15 MG tablet Take 15 mg by mouth daily as needed for pain.   Yes [provider]  Menaquinone-7 (VITAMIN K2 PO) Take by mouth.   Yes [provider]  Omega-3 1000 MG CAPS Take 1 capsule by mouth daily.   Yes [provider]  OVER THE COUNTER MEDICATION 1)Transitions Pure essence natural menopause relief 2) Femmenessence 3) Chlorophyll Complex 4) Ferrofood   Yes [provider]  TURMERIC PO Take by mouth.   Yes [provider]  VITAMIN A PO Take by mouth.   Yes [provider]  Sodium Sulfate-Mag Sulfate-KCl (SUTAB) [phone] MG TABS See instructions on Procedure instructions 10/29/20   Midge Minium, MD    Allergies as of 10/29/2020 - Review Complete 10/27/2020  Allergen Reaction Noted   Azithromycin Hives 06/19/2015   Bactrim [sulfamethoxazole-trimethoprim] Nausea And Vomiting 05/28/2017    Carbapenems  05/03/2016   Cephalosporins  05/03/2016   Morphine and related Hives 05/03/2016   Penicillins Swelling 06/19/2015   Quaternium-15  05/03/2016    Family History  Problem Relation Age of Onset   Colon cancer Father    Asthma Sister    Hypertension Sister    Alpha-1 antitrypsin deficiency Brother    Leukemia Maternal Grandmother    Heart attack Paternal Grandmother    Hypertension Paternal Grandfather    Diabetes Paternal Grandfather    CVA Paternal Grandfather    Breast cancer Neg Hx     Social History   Socioeconomic History   Marital status: Married    Spouse name: Not on file   Number of children: Not on file   Years of education: Not on file   Highest education level: Not on file  Occupational History   Not on file  Tobacco Use   Smoking status: Former   Smokeless tobacco: Never   Tobacco comments:    Smoke socially 1 yr in [employer]  Vaping Use   Vaping Use: Never used  Substance and Sexual Activity   Alcohol use: No    Alcohol/week: 0.0 standard drinks   Drug use: No   Sexual activity: Not on file  Other Topics Concern   Not on file  Social History Narrative   Not on file   Social Determinants of Health   Financial Resource Strain: Not on file  Food Insecurity: Not on file  Transportation Needs: Not on file  Physical Activity: Not on file  Stress: Not on file  Social Connections: Not on file  Intimate Partner Violence: Not on file    Review of Systems: See HPI, otherwise negative ROS  Physical Exam: BP 131/69   Pulse 80   Temp (!) 97.5 F (36.4 C) (Temporal)   Ht 5\' 2"  (1.575 m)   Wt 75.3 kg   SpO2 97%   BMI 30.36 kg/m  General:   Alert,  pleasant and cooperative in NAD Head:  Normocephalic and atraumatic. Neck:  Supple; no masses or thyromegaly. Lungs:  Clear throughout to auscultation.    Heart:  Regular rate and rhythm. Abdomen:  Soft, nontender and nondistended. Normal bowel sounds, without guarding, and without  rebound.   Neurologic:  Alert and  oriented x4;  grossly normal neurologically.  Impression/Plan: Heather Robertson is here for an colonoscopy to be performed for father with colon cancer a 50.  Risks, benefits, limitations, and alternatives regarding  colonoscopy have been reviewed with the patient.  Questions have been answered.  All parties agreeable.   Trevor Mace, MD  11/17/2020, 10:17 AM

## 2020-11-17 NOTE — Anesthesia Procedure Notes (Signed)
Date/Time: 11/17/2020 10:52 AM Performed by: Lily Kocher, CRNA Pre-anesthesia Checklist: Patient identified, Emergency Drugs available, Suction available, Patient being monitored and Timeout performed Patient Re-evaluated:Patient Re-evaluated prior to induction Oxygen Delivery Method: Simple face mask Induction Type: IV induction Placement Confirmation: positive ETCO2

## 2020-11-17 NOTE — Transfer of Care (Signed)
Immediate Anesthesia Transfer of Care Note  Patient: Heather Robertson  Procedure(s) Performed: COLONOSCOPY WITH PROPOFOL (Rectum) POLYPECTOMY (Rectum)  Patient Location: PACU  Anesthesia Type: General  Level of Consciousness: awake, alert  and patient cooperative  Airway and Oxygen Therapy: Patient Spontanous Breathing and Patient connected to supplemental oxygen  Post-op Assessment: Post-op Vital signs reviewed, Patient's Cardiovascular Status Stable, Respiratory Function Stable, Patent Airway and No signs of Nausea or vomiting  Post-op Vital Signs: Reviewed and stable  Complications: No notable events documented.

## 2020-11-18 ENCOUNTER — Encounter: Payer: Self-pay | Admitting: Gastroenterology

## 2020-11-19 ENCOUNTER — Encounter: Payer: Self-pay | Admitting: Gastroenterology

## 2020-11-19 LAB — SURGICAL PATHOLOGY

## 2020-11-25 ENCOUNTER — Encounter: Payer: No Typology Code available for payment source | Admitting: Family Medicine

## 2020-12-02 ENCOUNTER — Ambulatory Visit: Payer: 59 | Admitting: Podiatry

## 2020-12-02 ENCOUNTER — Ambulatory Visit: Payer: 59

## 2020-12-02 ENCOUNTER — Other Ambulatory Visit: Payer: Self-pay

## 2020-12-02 DIAGNOSIS — M79671 Pain in right foot: Secondary | ICD-10-CM | POA: Diagnosis not present

## 2020-12-02 MED ORDER — TERBINAFINE HCL 250 MG PO TABS: 250.0000 mg | ORAL_TABLET | Freq: Every day | ORAL | 0 refills | Status: DC

## 2020-12-02 MED ORDER — CLOTRIMAZOLE-BETAMETHASONE 1-0.05 % EX CREA: 1.0000 "application " | TOPICAL_CREAM | Freq: Two times a day (BID) | CUTANEOUS | 3 refills | Status: DC

## 2020-12-02 NOTE — Progress Notes (Signed)
   HPI: 50 y.o. female presenting today as a new patient for evaluation of peeling and cracking with burning and itching to the plantar aspect of the bilateral feet right greater than the left for the past few months now.  Patient states she does have a history of psoriasis.  She has been applying Lotrimin and clobetasol with modest improvement.  She presents for further treatment and evaluation.  She is concerned for possible tinea pedis.  She states that she does go camping often and uses public showers  Past Medical History:  Diagnosis Date   Medical history non-contributory      Physical Exam: General: The patient is alert and oriented x3 in no acute distress.  Dermatology: Hyperkeratotic skin with fissuring noted to the bilateral weightbearing surfaces of the feet right greater than the left.  Vascular: Palpable pedal pulses bilaterally. No edema or erythema noted. Capillary refill within normal limits.  Neurological: Epicritic and protective threshold grossly intact bilaterally.   Musculoskeletal Exam: No pedal deformity noted  Assessment: 1.  Tinea pedis bilateral. RT > LT   Plan of Care:  1. Patient evaluated.  2.  Prescription for Lamisil 250 mg #30 daily.  Patient denies a history of liver pathology or symptoms 3.  Prescription for Lotrisone cream apply 2 times daily 4.  Return to clinic in 4 weeks      Felecia Shelling, DPM [facility]  Dr. Felecia Shelling, DPM    [address] [place], [place] [address]                Office [phone]  Fax [phone]

## 2020-12-15 ENCOUNTER — Encounter: Payer: Self-pay | Admitting: Podiatry

## 2020-12-15 ENCOUNTER — Other Ambulatory Visit: Payer: Self-pay | Admitting: Podiatry

## 2020-12-15 MED ORDER — FLUCONAZOLE 150 MG PO TABS: 150.0000 mg | ORAL_TABLET | Freq: Every day | ORAL | 0 refills | Status: DC

## 2020-12-15 MED ORDER — CLOTRIMAZOLE-BETAMETHASONE 1-0.05 % EX CREA: 1.0000 "application " | TOPICAL_CREAM | Freq: Two times a day (BID) | CUTANEOUS | 3 refills | Status: DC

## 2020-12-25 ENCOUNTER — Encounter: Payer: Self-pay | Admitting: Podiatry

## 2021-01-02 ENCOUNTER — Ambulatory Visit: Payer: 59 | Admitting: Podiatry

## 2021-01-02 ENCOUNTER — Encounter: Payer: Self-pay | Admitting: Podiatry

## 2021-01-02 ENCOUNTER — Other Ambulatory Visit: Payer: Self-pay

## 2021-01-02 DIAGNOSIS — R234 Changes in skin texture: Secondary | ICD-10-CM | POA: Diagnosis not present

## 2021-01-02 MED ORDER — TRIAMCINOLONE ACETONIDE 0.1 % EX CREA: 1.0000 "application " | TOPICAL_CREAM | Freq: Two times a day (BID) | CUTANEOUS | 2 refills | Status: DC

## 2021-01-02 NOTE — Progress Notes (Signed)
   HPI: 50 y.o. female presenting today for follow-up evaluation of peeling and cracking with burning and itching to the plantar aspect of the bilateral feet right greater than the left for the past few months now.  Patient states she does have a history of psoriasis.  She has been applying Lotrimin and clobetasol with modest improvement.   Last visit antifungal medication was prescribed to ensure there is no fungal element.  She completed the oral antifungal medication and Lotrisone cream.  She says there is significant improvement.  She presents for further treatment evaluation  Past Medical History:  Diagnosis Date   Medical history non-contributory      Physical Exam: General: The patient is alert and oriented x3 in no acute distress.  Dermatology: Hyperkeratotic skin with fissuring noted to the bilateral weightbearing surfaces of the feet right greater than the left.  Vascular: Palpable pedal pulses bilaterally. No edema or erythema noted. Capillary refill within normal limits.  Neurological: Epicritic and protective threshold grossly intact bilaterally.   Musculoskeletal Exam: No pedal deformity noted  Assessment: 1.  Tinea pedis bilateral. RT > LT; resolved 2.  Fissuring of skin possibly secondary to psoriasis or other autoimmune   Plan of Care:  1. Patient evaluated.  2.  Patient has completed the oral antifungal as well as the Lotrisone cream. 3.  Prescription for Kenalog cream to apply 2 times daily 4.  Urea 40% lotion was also provided to apply 2 times daily 5.  Return to clinic in 6 weeks     Felecia Shelling, DPM [facility]  Dr. Felecia Shelling, DPM    [address] [place], [place] [address]                Office [phone]  Fax [phone]

## 2021-02-13 ENCOUNTER — Encounter: Payer: Self-pay | Admitting: Podiatry

## 2021-02-13 ENCOUNTER — Ambulatory Visit: Payer: 59 | Admitting: Podiatry

## 2021-02-13 ENCOUNTER — Other Ambulatory Visit: Payer: Self-pay

## 2021-02-13 DIAGNOSIS — R234 Changes in skin texture: Secondary | ICD-10-CM | POA: Diagnosis not present

## 2021-02-13 MED ORDER — HYDROXYZINE PAMOATE 25 MG PO CAPS: 25.0000 mg | ORAL_CAPSULE | Freq: Three times a day (TID) | ORAL | 1 refills | Status: DC | PRN

## 2021-02-13 NOTE — Progress Notes (Signed)
° °  HPI: 50 y.o. female presenting today for follow-up evaluation of peeling and cracking with burning and itching to the plantar aspect of the bilateral feet right greater than the left.  Patient continues to have the cracking and itching.  She says when she uses a pumice stone or tries to shave the callus area it only gets worse.  She is seeing another physician in [place], [place] for autoimmune and allergy testing.  She has also seen dermatology in the past with no significant answers or solutions.  She presents for follow-up treatment and evaluation  Past Medical History:  Diagnosis Date   Medical history non-contributory         Physical Exam: General: The patient is alert and oriented x3 in no acute distress.  Dermatology: Hyperkeratotic skin with fissuring noted to the bilateral weightbearing surfaces of the feet right greater than the left.  This is also present on the patient's hands  Vascular: Palpable pedal pulses bilaterally. No edema or erythema noted. Capillary refill within normal limits.  Neurological: Epicritic and protective threshold grossly intact bilaterally.   Musculoskeletal Exam: No pedal deformity noted  Assessment: 1.  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 Patient evaluated.  2.  Unfortunately the patient continues to have fissuring of the skin with diffuse hyperkeratosis on her hands and feet. 3.  In order to address the severe itching that the patient experiences intermittently, prescription for Atarax 25 mg every 8 hours as needed 4.  Continue urea 40% lotion daily 5.  Continue prescription Kenalog triamcinolone 0.1% cream 6.  Unfortunately I do not have anything additional to offer the patient.  She is working with another physician in [place], [place] and pursuing allergy testing and certain autoimmune disorders. 7.  Return to clinic as needed     Felecia Shelling,  DPM [facility]  Dr. Felecia Shelling, DPM    [address] [place], [place] [address]                Office [phone]  Fax [phone]

## 2021-02-17 ENCOUNTER — Ambulatory Visit: Payer: Self-pay | Admitting: *Deleted

## 2021-02-17 NOTE — Telephone Encounter (Signed)
°  Chief Complaint: coughing yellow mucus for 3 days Symptoms: coughing Frequency: having coughing spells worse at night Pertinent Negatives: Patient denies chest pain or wheezing, or shortness of breath Disposition: [] ED /[] Urgent Care (no appt availability in office) / [x] Appointment(In office/virtual)/ []  [facility]/ [] Home Care/ [] Refused Recommended Disposition  Additional Notes: I made her an in office appt for 02/19/2021 at 8:00 with Erin Mecum, PA-C.  I instructed her to go to the urgent care if her symptoms became worse like shortness of breath, chest tightness or wheezing or give a call back.   She verbalized understanding.

## 2021-02-17 NOTE — Telephone Encounter (Signed)
Reason for Disposition  [1] Continuous (nonstop) coughing interferes with work or school AND [2] no improvement using cough treatment per Care Advice  Answer Assessment - Initial Assessment Questions 1. ONSET: "When did the cough begin?"      I called her back.    She is coughing up yellow mucus for 3 days.   Now she is having cough attacks.   I usually use an inhaler when I'm sick.   I'm coughing bad at night. 2. SEVERITY: "How bad is the cough today?"      Worse 3. SPUTUM: "Describe the color of your sputum" (none, dry cough; clear, white, yellow, green)     Yellow mucus I have morning nasal congestion.   I'm using saline nasal sprays. 4. HEMOPTYSIS: "Are you coughing up any blood?" If so ask: "How much?" (flecks, streaks, tablespoons, etc.)     No 5. DIFFICULTY BREATHING: "Are you having difficulty breathing?" If Yes, ask: "How bad is it?" (e.g., mild, moderate, severe)    - MILD: No SOB at rest, mild SOB with walking, speaks normally in sentences, can lie down, no retractions, pulse < 100.    - MODERATE: SOB at rest, SOB with minimal exertion and prefers to sit, cannot lie down flat, speaks in phrases, mild retractions, audible wheezing, pulse 100-120.    - SEVERE: Very SOB at rest, speaks in single words, struggling to breathe, sitting hunched forward, retractions, pulse > 120      No chest heaviness or shortness of breath 6. FEVER: "Do you have a fever?" If Yes, ask: "What is your temperature, how was it measured, and when did it start?"     No   I feel ok.   The coughing attacks hit me suddenly.   I don't feel like I have post nasal drip 7. CARDIAC HISTORY: "Do you have any history of heart disease?" (e.g., heart attack, congestive heart failure)      Not asked 8. LUNG HISTORY: "Do you have any history of lung disease?"  (e.g., pulmonary embolus, asthma, emphysema)     No 9. PE RISK FACTORS: "Do you have a history of blood clots?" (or: recent major surgery, recent prolonged travel,  bedridden)     Not asked 10. OTHER SYMPTOMS: "Do you have any other symptoms?" (e.g., runny nose, wheezing, chest pain)       No diarrhea or nausea 11. PREGNANCY: "Is there any chance you are pregnant?" "When was your last menstrual period?"       N/A 12. TRAVEL: "Have you traveled out of the country in the last month?" (e.g., travel history, exposures)       Not asked  Protocols used: Cough - Acute Productive-A-AH

## 2021-02-18 NOTE — Patient Instructions (Addendum)
Based on your described symptoms and the duration of symptoms it is likely that you have a viral upper respiratory infection (often called a "cold")  Symptoms can last for 3-10 days with lingering cough and intermittent symptoms lasting weeks after that.  The goal of treatment at this time is to reduce your symptoms and discomfort    You can use over the counter medications such as Dayquil/Nyquil, AlkaSeltzer formulations, etc to provide further relief of symptoms according to the manufacturer's instructions  If preferred you can use Coricidin to manage your symptoms rather than those medications mentioned above.  I am also sending in a prescription for Tessalon pearls to help reduce your cough   Due to your history of asthma I am going to send in a prescription for Advair and a rescue inhaler You should use the Advair twice per day and the rescue inhaler every 4-6 hours as needed to reduce symptoms Be sure to avoid triggers as best as you can to reduce exacerbations   If your symptoms do not improve or become worse in the next 5-7 days please make an apt at the office so we can see you  Go to the ER if you begin to have more serious symptoms such as shortness of breath, trouble breathing, loss of consciousness, swelling around the eyes, high fever, severe lasting headaches, vision changes or neck pain/stiffness.

## 2021-02-18 NOTE — Progress Notes (Signed)
Acute Office Visit  Subjective:    Patient ID: Heather Robertson, female    DOB: [birth year], 50 y.o.   MRN: [id number]    Introduced myself to the patient as a PA-C and provided education on APPs in clinical practice.    Cough This is a new problem. The current episode started in the past 7 days. The problem has been gradually worsening. The cough is Productive of sputum. Pertinent negatives include no chills, ear pain, fever, headaches, hemoptysis, postnasal drip, rhinorrhea, sore throat, shortness of breath or wheezing.  Patient is in today for  productive cough for about a week.  She states her husband and herself were sick at the beginning of the month.  (Husband tested positive for Covid) She said she got better but coughing symptoms returned about a week ago.  Has not tried any OTC or antihistamines for management     Asthma: Usually experiences prolonged coughing after an illness  Coughing is still ongoing at this time.  States it is worse at night Reports improvement with inhaler usage but inhalers are expired.      Past Medical History:  Diagnosis Date   Medical history non-contributory     Past Surgical History:  Procedure Laterality Date   ABDOMINAL HYSTERECTOMY     COLONOSCOPY WITH PROPOFOL N/A 11/17/2020   Procedure: COLONOSCOPY WITH PROPOFOL;  Surgeon: Lucilla Lame, MD;  Location: [facility];  Service: Endoscopy;  Laterality: N/A;   POLYPECTOMY N/A 11/17/2020   Procedure: POLYPECTOMY;  Surgeon: Lucilla Lame, MD;  Location: [facility];  Service: Endoscopy;  Laterality: N/A;    Family History  Problem Relation Age of Onset   Colon cancer Father    Asthma Sister    Hypertension Sister    Alpha-1 antitrypsin deficiency Brother    Leukemia Maternal Grandmother    Heart attack Paternal Grandmother    Hypertension Paternal Grandfather    Diabetes Paternal Grandfather    CVA Paternal Grandfather    Breast cancer Neg Hx     Social History    Socioeconomic History   Marital status: Married    Spouse name: Not on file   Number of children: Not on file   Years of education: Not on file   Highest education level: Not on file  Occupational History   Not on file  Tobacco Use   Smoking status: Former   Smokeless tobacco: Never   Tobacco comments:    Smoke socially 1 yr in [place] Use   Vaping Use: Never used  Substance and Sexual Activity   Alcohol use: No    Alcohol/week: 0.0 standard drinks   Drug use: No   Sexual activity: Not on file  Other Topics Concern   Not on file  Social History Narrative   Not on file   Social Determinants of Health   Financial Resource Strain: Not on file  Food Insecurity: Not on file  Transportation Needs: Not on file  Physical Activity: Not on file  Stress: Not on file  Social Connections: Not on file  Intimate Partner Violence: Not on file    Outpatient Medications Prior to Visit  Medication Sig Dispense Refill   fluconazole (DIFLUCAN) 150 MG tablet Take 1 tablet (150 mg total) by mouth daily. 14 tablet 0   triamcinolone cream (KENALOG) 0.1 % Apply 1 application topically 2 (two) times daily. 90 g 2   Ascorbic Acid (VITAMIN C PO) Take by mouth.     B  Complex Vitamins (VITAMIN-B COMPLEX PO) Take by mouth.     Cholecalciferol (VITAMIN D3 PO) Take by mouth.     clotrimazole-betamethasone (LOTRISONE) cream Apply 1 application topically 2 (two) times daily. 45 g 3   Coenzyme Q10 (CO Q-10 PO) Take by mouth.     Flaxseed, Linseed, (FLAX SEED OIL PO) Take by mouth.     hydrOXYzine (VISTARIL) 25 MG capsule Take 1 capsule (25 mg total) by mouth every 8 (eight) hours as needed. 30 capsule 1   MAGNESIUM PO Take by mouth.     meloxicam (MOBIC) 15 MG tablet Take 15 mg by mouth daily as needed for pain.     Menaquinone-7 (VITAMIN K2 PO) Take by mouth.     Omega-3 1000 MG CAPS Take 1 capsule by mouth daily.     OVER THE COUNTER MEDICATION 1)Transitions Pure essence natural  menopause relief 2) Femmenessence 3) Chlorophyll Complex 4) Ferrofood     Sodium Sulfate-Mag Sulfate-KCl (SUTAB) [phone] MG TABS See instructions on Procedure instructions 24 tablet 0   terbinafine (LAMISIL) 250 MG tablet Take 1 tablet (250 mg total) by mouth daily. 30 tablet 0   TURMERIC PO Take by mouth.     VITAMIN A PO Take by mouth.     No facility-administered medications prior to visit.    Allergies  Allergen Reactions   Azithromycin Hives   Bactrim [Sulfamethoxazole-Trimethoprim] Nausea And Vomiting    dizziness   Carbapenems    Cephalosporins    Morphine And Related Hives   Penicillins Swelling   Quaternium-15     Review of Systems  Constitutional: Negative.  Negative for chills, fatigue and fever.  HENT:  Positive for congestion. Negative for ear discharge, ear pain, postnasal drip, rhinorrhea, sinus pressure, sinus pain, sneezing, sore throat, trouble swallowing and voice change.   Eyes: Negative.   Respiratory:  Positive for cough. Negative for apnea, hemoptysis, choking, chest tightness, shortness of breath, wheezing and stridor.   Gastrointestinal: Negative.   Neurological:  Negative for dizziness, syncope, weakness, light-headedness and headaches.      Objective:    Physical Exam Constitutional:      Appearance: Normal appearance. She is well-developed and well-groomed.  HENT:     Head: Normocephalic and atraumatic.     Right Ear: Hearing, tympanic membrane, ear canal and external ear normal.     Left Ear: Hearing, tympanic membrane, ear canal and external ear normal.     Ears:     Comments: Ear lavage performed to reveal normal TM without erythema, bulging, retraction, perforation     Nose: Nose normal.     Right Sinus: No maxillary sinus tenderness or frontal sinus tenderness.     Left Sinus: No maxillary sinus tenderness or frontal sinus tenderness.  Eyes:     General: Lids are normal.     Conjunctiva/sclera: Conjunctivae normal.     Pupils:  Pupils are equal, round, and reactive to light.  Cardiovascular:     Rate and Rhythm: Normal rate and regular rhythm.     Pulses: Normal pulses.     Heart sounds: Normal heart sounds.  Pulmonary:     Effort: Pulmonary effort is normal.     Breath sounds: Normal air entry. Examination of the right-lower field reveals decreased breath sounds. Examination of the left-lower field reveals decreased breath sounds. Decreased breath sounds present. No wheezing, rhonchi or rales.     Comments: Negative egophony in all lung fields Musculoskeletal:     Cervical back: Normal  range of motion and neck supple.     Right lower leg: No edema.     Left lower leg: No edema.  Lymphadenopathy:     Head:     Right side of head: Submandibular adenopathy present. No submental adenopathy.     Left side of head: Submandibular adenopathy present. No submental adenopathy.     Upper Body:     Right upper body: No supraclavicular adenopathy.     Left upper body: No supraclavicular adenopathy.  Neurological:     Mental Status: She is alert.  Psychiatric:        Behavior: Behavior is cooperative.    BP (!) 103/54 (BP Location: Right Arm, Patient Position: Sitting, Cuff Size: Large)    Pulse 92    Temp 98.1 F (36.7 C) (Oral)    Wt 160 lb (72.6 kg)    SpO2 98%    BMI 29.26 kg/m  Wt Readings from Last 3 Encounters:  02/19/21 160 lb (72.6 kg)  11/17/20 166 lb (75.3 kg)  10/27/20 174 lb (78.9 kg)    Health Maintenance Due  Topic Date Due   COVID-19 Vaccine (1) Never done   Pneumococcal Vaccine 70-52 Years old (1 - PCV) Never done   HIV Screening  Never done   Hepatitis C Screening  Never done   Zoster Vaccines- Shingrix (1 of 2) Never done   PAP SMEAR-Modifier  04/23/2018   INFLUENZA VACCINE  09/29/2020    There are no preventive care reminders to display for this patient.   Lab Results  Component Value Date   TSH 2.190 10/27/2020   Lab Results  Component Value Date   WBC 6.2 10/27/2020   HGB  13.8 10/27/2020   HCT 40.8 10/27/2020   MCV 89 10/27/2020   PLT 310 10/27/2020   Lab Results  Component Value Date   NA 143 10/27/2020   K 4.5 10/27/2020   CO2 22 10/27/2020   GLUCOSE 93 10/27/2020   BUN 10 10/27/2020   CREATININE 0.68 10/27/2020   BILITOT 0.3 10/27/2020   ALKPHOS 73 10/27/2020   AST 16 10/27/2020   ALT 15 10/27/2020   PROT 7.3 10/27/2020   ALBUMIN 4.9 (H) 10/27/2020   CALCIUM 9.7 10/27/2020   EGFR 106 10/27/2020   Lab Results  Component Value Date   CHOL 241 (H) 10/27/2020   Lab Results  Component Value Date   HDL 39 (L) 10/27/2020   Lab Results  Component Value Date   LDLCALC 176 (H) 10/27/2020   Lab Results  Component Value Date   TRIG 142 10/27/2020   Lab Results  Component Value Date   CHOLHDL 6.2 (H) 10/27/2020   No results found for: HGBA1C     Assessment & Plan:     Problem List Items Addressed This Visit       Respiratory   Cough variant asthma - Primary    Acute exacerbation of chronic condition likely due to recent COVID and URI infections Provided Advair and Albuterol inhalers to reduce symptoms and manage during current exacerbation Ambulatory referral to pulmonology for asthma provided as she has not had full workup in the past and is unsure of her full dx Follow up if symptoms not improving Provided ER precautions as well.       Relevant Medications   fluticasone-salmeterol (ADVAIR DISKUS) 100-50 MCG/ACT AEPB   albuterol (VENTOLIN HFA) 108 (90 Base) MCG/ACT inhaler   Other Relevant Orders   Ambulatory referral to Pulmonology   Upper  respiratory tract infection    Resolving acute URI likely viral in nature Persistent cough likely due to asthma exacerbation as patient has historic asthma dx in chart review Provided Tessalon pearls, Advair and albuterol inhalers to reduce cough and asthma symptoms Reviewed return precautions and ER precautions with patient       Relevant Medications   benzonatate (TESSALON) 100 MG  capsule     No follow-ups on file.   I, Kiarah Eckstein E Adiya Selmer, PA-C, have reviewed all documentation for this visit. The documentation on 02/19/21 for the exam, diagnosis, procedures, and orders are all accurate and complete.   Yaniv Lage, Dani Gobble, PA-C MPH [facility]    Meds ordered this encounter  Medications   fluticasone-salmeterol (ADVAIR DISKUS) 100-50 MCG/ACT AEPB    Sig: Inhale 1 puff into the lungs 2 (two) times daily.    Dispense:  30 each    Refill:  3   albuterol (VENTOLIN HFA) 108 (90 Base) MCG/ACT inhaler    Sig: Inhale 2 puffs into the lungs every 6 (six) hours as needed for wheezing or shortness of breath.    Dispense:  8 g    Refill:  3   benzonatate (TESSALON) 100 MG capsule    Sig: Take 1 capsule (100 mg total) by mouth 2 (two) times daily as needed for cough.    Dispense:  20 capsule    Refill:  0     Kameran Mcneese E Arvie Villarruel, PA-C

## 2021-02-19 ENCOUNTER — Other Ambulatory Visit: Payer: Self-pay

## 2021-02-19 ENCOUNTER — Ambulatory Visit (INDEPENDENT_AMBULATORY_CARE_PROVIDER_SITE_OTHER): Payer: 59 | Admitting: Physician Assistant

## 2021-02-19 ENCOUNTER — Encounter: Payer: Self-pay | Admitting: Physician Assistant

## 2021-02-19 VITALS — BP 103/54 | HR 92 | Temp 98.1°F | Wt 160.0 lb

## 2021-02-19 DIAGNOSIS — J45991 Cough variant asthma: Secondary | ICD-10-CM | POA: Diagnosis not present

## 2021-02-19 DIAGNOSIS — J069 Acute upper respiratory infection, unspecified: Secondary | ICD-10-CM | POA: Diagnosis not present

## 2021-02-19 MED ORDER — BENZONATATE 100 MG PO CAPS
100.0000 mg | ORAL_CAPSULE | Freq: Two times a day (BID) | ORAL | 0 refills | Status: DC | PRN
Start: 2021-02-19 — End: 2021-04-06

## 2021-02-19 MED ORDER — FLUTICASONE-SALMETEROL 100-50 MCG/ACT IN AEPB: 1.0000 | INHALATION_SPRAY | Freq: Two times a day (BID) | RESPIRATORY_TRACT | 3 refills | Status: DC

## 2021-02-19 MED ORDER — ALBUTEROL SULFATE HFA 108 (90 BASE) MCG/ACT IN AERS: 2.0000 | INHALATION_SPRAY | Freq: Four times a day (QID) | RESPIRATORY_TRACT | 3 refills | Status: DC | PRN

## 2021-02-19 NOTE — Assessment & Plan Note (Signed)
Resolving acute URI likely viral in nature Persistent cough likely due to asthma exacerbation as patient has historic asthma dx in chart review Provided Tessalon pearls, Advair and albuterol inhalers to reduce cough and asthma symptoms Reviewed return precautions and ER precautions with patient

## 2021-02-19 NOTE — Assessment & Plan Note (Addendum)
Acute exacerbation of chronic condition likely due to recent COVID and URI infections Provided Advair and Albuterol inhalers to reduce symptoms and manage during current exacerbation Ambulatory referral to pulmonology for asthma provided as she has not had full workup in the past and is unsure of her full dx Follow up if symptoms not improving Provided ER precautions as well.

## 2021-03-12 ENCOUNTER — Encounter: Payer: Self-pay | Admitting: Physician Assistant

## 2021-03-13 ENCOUNTER — Encounter: Payer: Self-pay | Admitting: Physician Assistant

## 2021-03-18 ENCOUNTER — Other Ambulatory Visit: Payer: Self-pay | Admitting: Obstetrics and Gynecology

## 2021-03-24 ENCOUNTER — Other Ambulatory Visit: Payer: Self-pay

## 2021-03-24 ENCOUNTER — Ambulatory Visit (INDEPENDENT_AMBULATORY_CARE_PROVIDER_SITE_OTHER): Payer: 59 | Admitting: Physician Assistant

## 2021-03-24 ENCOUNTER — Encounter: Payer: Self-pay | Admitting: Physician Assistant

## 2021-03-24 VITALS — BP 115/52 | HR 79 | Temp 98.3°F | Wt 157.0 lb

## 2021-03-24 DIAGNOSIS — R7989 Other specified abnormal findings of blood chemistry: Secondary | ICD-10-CM

## 2021-03-24 DIAGNOSIS — R0683 Snoring: Secondary | ICD-10-CM

## 2021-03-24 DIAGNOSIS — Z114 Encounter for screening for human immunodeficiency virus [HIV]: Secondary | ICD-10-CM

## 2021-03-24 DIAGNOSIS — K219 Gastro-esophageal reflux disease without esophagitis: Secondary | ICD-10-CM | POA: Diagnosis not present

## 2021-03-24 DIAGNOSIS — R69 Illness, unspecified: Secondary | ICD-10-CM | POA: Diagnosis not present

## 2021-03-24 DIAGNOSIS — L409 Psoriasis, unspecified: Secondary | ICD-10-CM | POA: Diagnosis not present

## 2021-03-24 DIAGNOSIS — Z1159 Encounter for screening for other viral diseases: Secondary | ICD-10-CM | POA: Diagnosis not present

## 2021-03-24 NOTE — Progress Notes (Signed)
Established patient visit   Patient: Heather Robertson   DOB: Sep 17, 1970   51 y.o. Female  MRN: [id number] Visit Date: 03/24/2021  Today's healthcare provider: Alfredia Ferguson, PA-C   Chief Complaint  Patient presents with   Rash   Subjective    Rash This is a chronic problem. Episode onset: Started April 2022. The problem has been waxing and waning since onset. Location: Bilateral hands, elbows, feet and back of neck. She was exposed to nothing. Treatments tried: Diet change (AIP Diet) Her past medical history is significant for allergies.    Heather Robertson is a 51 y/o female who presents today for discussion of bloodwork she recently got through her chiropractor.   She starting seeing Heather Robertson, DC for help with psoriasis. Through diet changes, primarily AIP diet and avoiding food allergies, after extensive allergy testing, her symptoms of psoriasis have improved. She managed with hydrating lotions and apple cider vinegar soaks. Denies any anaphylactic allergies or hives, but has felt flushed and itching after eating before.  Her primary symptomatic concern is 'attacks' of pain, she feels central chest pain, burning, stabbing bursts of pain that last anywhere from 20 min to a few hours. History of seeing a cardiologist 5-6 years ago with a negative workup per pt. She has attributed these symptoms to acid reflux, as taking TUMS almost always relieves her symptoms or at least diminishes them. Her last 'attack' was in November, has noticed a decline in frequency post diet changes.   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   Amylase-Lipase-Protease (ENZYMAX PO) Take by mouth.   Ascorbic Acid (VITAMIN C PO) Take by mouth.   B Complex Vitamins (VITAMIN-B COMPLEX PO) Take by mouth.   benzonatate (TESSALON) 100 MG capsule Take 1 capsule (100 mg total) by mouth 2 (two) times daily as needed for cough.   Bioflavonoid Products (QUERCETIN COMPLEX IMMUNE) CAPS Take by mouth.   Cholecalciferol (VITAMIN D3 PO) Take by mouth.   clotrimazole-betamethasone (LOTRISONE) cream Apply 1 application topically 2 (two) times daily.   Coenzyme Q10 (CO Q-10 PO) Take by mouth.   Flaxseed, Linseed, (FLAX SEED OIL PO) Take by mouth.   fluconazole (DIFLUCAN) 150 MG tablet Take 1 tablet (150 mg total) by mouth daily.   fluticasone-salmeterol (ADVAIR DISKUS) 100-50 MCG/ACT AEPB Inhale 1 puff into the lungs 2 (two) times daily.   guaiFENesin (MUCOSA PO) Take by mouth.   hydrOXYzine (VISTARIL) 25 MG capsule Take 1 capsule (25 mg total) by mouth every 8 (eight) hours as needed.   MAGNESIUM PO Take by mouth.   meloxicam (MOBIC) 15 MG tablet Take 15 mg by mouth daily as needed for pain.   Menaquinone-7 (VITAMIN K2 PO) Take by mouth.   Multiple Vitamin (MULTIVITAMIN) capsule Take 1 capsule by mouth daily.   Omega-3 1000 MG CAPS Take 1 capsule by mouth daily.   OVER THE COUNTER MEDICATION 1)Transitions  Pure essence natural menopause relief 2) Femmenessence 3) Chlorophyll Complex 4) Ferrofood   Probiotic Product (PROBIOTIC-10 PO) Take by mouth.   Sodium Sulfate-Mag Sulfate-KCl (SUTAB) [phone] MG TABS See instructions on Procedure instructions   terbinafine (LAMISIL) 250 MG tablet Take 1 tablet (250 mg total) by mouth daily.   triamcinolone cream (KENALOG) 0.1 % Apply 1 application topically 2 (two) times daily.    TURMERIC PO Take by mouth.   VITAMIN A PO Take by mouth.   No facility-administered medications prior to visit.    Review of Systems  Constitutional: Negative.   Respiratory: Negative.    Cardiovascular: Negative.   Gastrointestinal: Negative.   Skin:  Positive for rash.  Allergic/Immunologic: Positive for food allergies.  Neurological:  Negative for dizziness, light-headedness and headaches.      Objective    BP (!) 115/52 (BP Location: Right Arm, Patient Position: Sitting, Cuff Size: Normal)    Pulse 79    Temp 98.3 F (36.8 C) (Oral)    Wt 157 lb (71.2 kg)    SpO2 99%    BMI 28.72 kg/m    Physical Exam Constitutional:      General: She is awake.     Appearance: She is well-developed.  HENT:     Head: Normocephalic.  Eyes:     Conjunctiva/sclera: Conjunctivae normal.  Cardiovascular:     Rate and Rhythm: Normal rate and regular rhythm.     Heart sounds: Normal heart sounds.  Pulmonary:     Effort: Pulmonary effort is normal.     Breath sounds: Normal breath sounds.  Skin:    General: Skin is warm.     Findings: Rash present.     Comments: Palms with thickened dry skin, with cracks  Neurological:     Mental Status: She is alert and oriented to person, place, and time.  Psychiatric:        Attention and Perception: Attention normal.        Mood and Affect: Mood normal.        Speech: Speech normal.        Behavior: Behavior is cooperative.     No results found for any visits on 03/24/21.  Assessment & Plan     Problem List Items Addressed This Visit       Digestive   Gastroesophageal reflux disease - Primary    Does sound like the cause of her 'attacks' as they are relieved by antacids. Hesitant to tx with PPI, Will test for hypylori to r/o If neg, advised f/u with GI for endoscopy +occult blood may be upper GI in etiology, but she also had internal hemorrhoids on her last colonoscopy 9/22, which was otherwise without concern      Relevant  Medications   Amylase-Lipase-Protease (ENZYMAX PO)   Probiotic Product (PROBIOTIC-10 PO)   Other Relevant Orders   CBC w/Diff/Platelet   H. pylori breath test     Musculoskeletal and Integument   Psoriasis    Encouraged current plan of avoiding food triggers, advised to keep areas, mainly palms and soles, clean and moisturized to prevent infection  If symptoms increase, can send to derm for advice on biologic, or try < 10 day courses of topical steroids for flares        Other   High serum ferritin    May be a lab error Not concerned Will repeat for reassurance      Relevant Orders   Iron, TIBC and Ferritin Panel   Encounter  for hepatitis C screening test for low risk patient    Screening in general but also due to 'positive Ig G to CYP450'      Relevant Orders   Hepatitis C antibody   Snoring    Doesn't meet criteria to check for sleep apnea, will monitor       Other Visit Diagnoses     Encounter for screening for HIV       Relevant Orders   HIV antibody (with reflex)      --positive alpha myosin antibiodies ---at this time not addressing, will research further  Around 40 minutes was spent explaining bloodwork and recent tests, ongoing symptoms, and current plan  Return in about 3 months (around 06/22/2021) for hyperlipidemia.     I,Laura E Walsh,acting as a Neurosurgeon for [employer], PA-C.,have documented all relevant documentation on the behalf of Alfredia Ferguson, PA-C,as directed by  Alfredia Ferguson, PA-C while in the presence of Alfredia Ferguson, PA-C.    Alfredia Ferguson, PA-C  [facility] [phone] (phone) [phone] (fax)  [facility]

## 2021-03-24 NOTE — Assessment & Plan Note (Signed)
Encouraged current plan of avoiding food triggers, advised to keep areas, mainly palms and soles, clean and moisturized to prevent infection  If symptoms increase, can send to derm for advice on biologic, or try < 10 day courses of topical steroids for flares

## 2021-03-24 NOTE — Assessment & Plan Note (Signed)
May be a lab error Not concerned Will repeat for reassurance

## 2021-03-24 NOTE — Assessment & Plan Note (Signed)
Doesn't meet criteria to check for sleep apnea, will monitor

## 2021-03-24 NOTE — Assessment & Plan Note (Signed)
Screening in general but also due to 'positive Ig G to CYP450'

## 2021-03-24 NOTE — Assessment & Plan Note (Signed)
Does sound like the cause of her 'attacks' as they are relieved by antacids. Hesitant to tx with PPI, Will test for hypylori to r/o If neg, advised f/u with GI for endoscopy +occult blood may be upper GI in etiology, but she also had internal hemorrhoids on her last colonoscopy 9/22, which was otherwise without concern

## 2021-03-25 LAB — CBC WITH DIFFERENTIAL/PLATELET
Basophils Absolute: 0.1 10*3/uL (ref 0.0–0.2)
Basos: 1 %
EOS (ABSOLUTE): 0.2 10*3/uL (ref 0.0–0.4)
Eos: 2 %
Hematocrit: 42 % (ref 34.0–46.6)
Hemoglobin: 13.7 g/dL (ref 11.1–15.9)
Immature Grans (Abs): 0 10*3/uL (ref 0.0–0.1)
Immature Granulocytes: 0 %
Lymphocytes Absolute: 1.8 10*3/uL (ref 0.7–3.1)
Lymphs: 23 %
MCH: 29.4 pg (ref 26.6–33.0)
MCHC: 32.6 g/dL (ref 31.5–35.7)
MCV: 90 fL (ref 79–97)
Monocytes Absolute: 0.6 10*3/uL (ref 0.1–0.9)
Monocytes: 8 %
Neutrophils Absolute: 5.3 10*3/uL (ref 1.4–7.0)
Neutrophils: 66 %
Platelets: 338 10*3/uL (ref 150–450)
RBC: 4.66 x10E6/uL (ref 3.77–5.28)
RDW: 13.1 % (ref 11.7–15.4)
WBC: 7.9 10*3/uL (ref 3.4–10.8)

## 2021-03-25 LAB — IRON,TIBC AND FERRITIN PANEL
Ferritin: 109 ng/mL (ref 15–150)
Iron Saturation: 18 % (ref 15–55)
Iron: 57 ug/dL (ref 27–159)
Total Iron Binding Capacity: 323 ug/dL (ref 250–450)
UIBC: 266 ug/dL (ref 131–425)

## 2021-03-25 LAB — H. PYLORI BREATH TEST: H pylori Breath Test: NEGATIVE

## 2021-03-25 LAB — HIV ANTIBODY (ROUTINE TESTING W REFLEX): HIV Screen 4th Generation wRfx: NONREACTIVE

## 2021-03-25 LAB — HEPATITIS C ANTIBODY: Hep C Virus Ab: <0.1 s/co ratio (ref 0.0–0.9)

## 2021-03-26 ENCOUNTER — Other Ambulatory Visit: Payer: Self-pay | Admitting: Physician Assistant

## 2021-03-26 ENCOUNTER — Encounter: Payer: Self-pay | Admitting: Physician Assistant

## 2021-03-26 DIAGNOSIS — R195 Other fecal abnormalities: Secondary | ICD-10-CM

## 2021-03-26 DIAGNOSIS — K219 Gastro-esophageal reflux disease without esophagitis: Secondary | ICD-10-CM

## 2021-03-26 DIAGNOSIS — R079 Chest pain, unspecified: Secondary | ICD-10-CM

## 2021-03-27 ENCOUNTER — Telehealth: Payer: Self-pay

## 2021-03-27 NOTE — Telephone Encounter (Signed)
Scheduled for 05/13/2021 °

## 2021-04-02 ENCOUNTER — Other Ambulatory Visit: Payer: Self-pay | Admitting: Obstetrics and Gynecology

## 2021-04-02 DIAGNOSIS — R634 Abnormal weight loss: Secondary | ICD-10-CM | POA: Diagnosis not present

## 2021-04-02 DIAGNOSIS — R922 Inconclusive mammogram: Secondary | ICD-10-CM | POA: Diagnosis not present

## 2021-04-02 DIAGNOSIS — Z1231 Encounter for screening mammogram for malignant neoplasm of breast: Secondary | ICD-10-CM | POA: Diagnosis not present

## 2021-04-06 ENCOUNTER — Other Ambulatory Visit: Payer: Self-pay

## 2021-04-06 ENCOUNTER — Ambulatory Visit: Payer: 59 | Admitting: Internal Medicine

## 2021-04-06 ENCOUNTER — Encounter: Payer: Self-pay | Admitting: Internal Medicine

## 2021-04-06 DIAGNOSIS — J45991 Cough variant asthma: Secondary | ICD-10-CM | POA: Diagnosis not present

## 2021-04-06 MED ORDER — ALBUTEROL SULFATE HFA 108 (90 BASE) MCG/ACT IN AERS: INHALATION_SPRAY | RESPIRATORY_TRACT | 11 refills | Status: DC

## 2021-04-06 NOTE — Patient Instructions (Addendum)
For cough or wheeze or short of breath > try Proair hfa(inhaler not powder)  1- 2 puffs  up to every 4 hours as needed.   Ok to try albuterol 15 min before an activity (on alternating days)  that you know would usually make you short of breath and see if it makes any difference and if makes none then don't take albuterol after activity unless you can't catch your breath as this means it's the resting that helps, not the albuterol.  For night time cough > pepcid 20 mg at bedtime   GERD (REFLUX)  is an extremely common cause of respiratory symptoms just like yours , many times with no obvious heartburn at all.    It can be treated with medication, but also with lifestyle changes including elevation of the head of your bed (ideally with 6 -8inch blocks under the headboard of your bed),  Smoking cessation, avoidance of late meals, excessive alcohol, and avoid fatty foods, chocolate, peppermint, colas, red wine, and acidic juices such as orange juice.  NO MINT OR MENTHOL PRODUCTS SO NO COUGH DROPS  USE SUGARLESS CANDY INSTEAD (Jolley ranchers or Stover's or Life Savers) or even ice chips will also do - the key is to swallow to prevent all throat clearing. NO OIL BASED VITAMINS - use powdered substitutes.  Avoid fish oil when coughing.    Pulmonary follow up can be as needed

## 2021-04-06 NOTE — Assessment & Plan Note (Addendum)
Onset in her 20's better p prednisone  And ? Response to ICS but no worse when stopped  - rec 04/06/2021 add pepcid 20 mg hs for noct cough and coached on approp use of hfa saba prn  Not clear at all this is really asthma but has seen [place] [facility] for allergies and is at risk so:  - The proper method of use, as well as anticipated side effects, of a metered-dose inhaler were discussed and demonstrated to the patient using teach back method   - reviewed the rule of 2s and the overlap of asthma and gerd rx   - GERD diet/ bed blocks reviewed and h2 hs added for now to see if still perceives need for saba or still has flares requiring symb 80 2 bid prn Based on two studies from NEJM  378; 20 p 1865 (2018) and 380 : p2020-30 (2019) in pts with mild asthma it is reasonable to use low dose symbicort eg 80 2bid "prn" flare in this setting but I emphasized this was only shown with symbicort and takes advantage of the rapid onset of action but is not the same as "rescue therapy" but can be stopped once the acute symptoms have resolved and the need for rescue has been minimized (< 2 x weekly)            Each maintenance medication was reviewed in detail including emphasizing most importantly the difference between maintenance and prns and under what circumstances the prns are to be triggered using an action plan format where appropriate.  Total time for H and P, chart review, counseling, reviewing hfa device(s) and generating customized AVS unique to this initial office visit / same day charting  > 45 min

## 2021-04-06 NOTE — Progress Notes (Signed)
Heather Robertson, female    DOB: 10-13-1970   MRN: [id number]   Brief patient profile:  89   yowf never regular smoker lifelong problems allergy = itching sneezing esp fall > spring rx 2018   referred to pulmonary clinic in [facility]  04/06/2021 by Junie Panning Mecum PA for noct cough   Onset in her twenties with cough / wheeze rx  ICS and freq prednisone helped  > rx advair and then breo and ? Some better but not worse when stopped so stayed off   Dec 2022 covid cough BREO> did not continue and still having noct cough at time of first pulmonary ov which she ? If she's had for years and is just not aware of it.    History of Present Illness  04/06/2021  Pulmonary/ 1st office eval/ Neila Teem / [facility]  Chief Complaint  Patient presents with   Consult  Dyspnea:  Not limited by breathing from desired activities   Cough: not sure what's waking her up but does have feq HB/indigestion   Sleep: flat bed 2 pillows  SABA use: not using now  Was having bad hb but better with wt down 21 lb intentionally and plans f/u with GI   No obvious day to day or daytime variability or assoc ongoing  excess/ purulent sputum or mucus plugs or hemoptysis or cp or chest tightness, subjective wheeze or overt sinus or ongoing hb symptoms.    Also denies any obvious fluctuation of symptoms with weather or environmental changes or other aggravating or alleviating factors except as outlined above   No unusual exposure hx or h/o childhood pna/ asthma or knowledge of premature birth.  Current Allergies, Complete Past Medical History, Past Surgical History, Family History, and Social History were reviewed in [employer] record.  ROS  The following are not active complaints unless bolded Hoarseness, sore throat, dysphagia, dental problems, itching, sneezing,  nasal congestion or discharge of excess mucus or purulent secretions, ear ache,   fever, chills, sweats, unintended wt loss or wt gain,  classically pleuritic or exertional cp,  orthopnea pnd or arm/hand swelling  or leg swelling, presyncope, palpitations, abdominal pain, anorexia, nausea, vomiting, diarrhea  or change in bowel habits or change in bladder habits, change in stools or change in urine, dysuria, hematuria,  rash, arthralgias, visual complaints, headache, numbness, weakness or ataxia or problems with walking or coordination,  change in mood or  memory.           Past Medical History:  Diagnosis Date   Medical history non-contributory    Polyp of descending colon     Outpatient Medications Prior to Visit - - NOTE:   Unable to verify as accurately reflecting what pt takes    Medication Sig Dispense Refill   albuterol (VENTOLIN HFA) 108 (90 Base) MCG/ACT inhaler Inhale 2 puffs into the lungs every 6 (six) hours as needed for wheezing or shortness of breath. 8 g 3   Amylase-Lipase-Protease (ENZYMAX PO) Take by mouth.     Bioflavonoid Products (QUERCETIN COMPLEX IMMUNE) CAPS Take by mouth.     Cholecalciferol (VITAMIN D3 PO) Take by mouth.     clotrimazole-betamethasone (LOTRISONE) cream Apply 1 application topically 2 (two) times daily. 45 g 3   fluticasone-salmeterol (ADVAIR DISKUS) 100-50 MCG/ACT AEPB Inhale 1 puff into the lungs 2 (two) times daily. 30 each 3   hydrOXYzine (VISTARIL) 25 MG capsule Take 1 capsule (25 mg total) by mouth every 8 (eight)  hours as needed. 30 capsule 1   Multiple Vitamin (MULTIVITAMIN) capsule Take 1 capsule by mouth daily.     Omega-3 1000 MG CAPS Take 1 capsule by mouth daily.     Probiotic Product (PROBIOTIC-10 PO) Take by mouth.     triamcinolone cream (KENALOG) 0.1 % Apply 1 application topically 2 (two) times daily. 90 g 2   Ascorbic Acid (VITAMIN C PO) Take by mouth.     B Complex Vitamins (VITAMIN-B COMPLEX PO) Take by mouth.     benzonatate (TESSALON) 100 MG capsule Take 1 capsule (100 mg total) by mouth 2 (two) times daily as needed for cough. 20 capsule 0   Coenzyme Q10 (CO  Q-10 PO) Take by mouth.     Flaxseed, Linseed, (FLAX SEED OIL PO) Take by mouth.     fluconazole (DIFLUCAN) 150 MG tablet Take 1 tablet (150 mg total) by mouth daily. 14 tablet 0   guaiFENesin (MUCOSA PO) Take by mouth.     MAGNESIUM PO Take by mouth.     meloxicam (MOBIC) 15 MG tablet Take 15 mg by mouth daily as needed for pain.     Menaquinone-7 (VITAMIN K2 PO) Take by mouth.     OVER THE COUNTER MEDICATION 1)Transitions Pure essence natural menopause relief 2) Femmenessence 3) Chlorophyll Complex 4) Ferrofood     Sodium Sulfate-Mag Sulfate-KCl (SUTAB) [phone] MG TABS See instructions on Procedure instructions 24 tablet 0   terbinafine (LAMISIL) 250 MG tablet Take 1 tablet (250 mg total) by mouth daily. 30 tablet 0   TURMERIC PO Take by mouth.     VITAMIN A PO Take by mouth.     No facility-administered medications prior to visit.     Objective:     BP 112/80 (BP Location: Left Arm, Patient Position: Sitting, Cuff Size: Normal)    Pulse 82    Temp (!) 97.1 F (36.2 C) (Oral)    Ht 5\' 2"  (1.575 m)    Wt 152 lb 12.8 oz (69.3 kg)    SpO2 97%    BMI 27.95 kg/m   SpO2: 97 % RA  Pleasant amb wf nad   HEENT : pt wearing mask not removed for exam due to covid -19 concerns.    NECK :  without JVD/Nodes/TM/ nl carotid upstrokes bilaterally   LUNGS: no acc muscle use,  Nl contour chest which is clear to A and P bilaterally without cough on insp or exp maneuvers   CV:  RRR  no s3 or murmur or increase in P2, and no edema   ABD:  soft and nontender with nl inspiratory excursion in the supine position. No bruits or organomegaly appreciated, bowel sounds nl  MS:  Nl gait/ ext warm without deformities, calf tenderness, cyanosis or clubbing No obvious joint restrictions   SKIN: warm and dry without lesions    NEURO:  alert, approp, nl sensorium with  no motor or cerebellar deficits apparent.       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 Response to ICS but no worse when stopped  - rec 04/06/2021 add pepcid 20 mg hs for noct cough and coached on approp use of hfa saba prn  Not clear at all this is really asthma but has seen Donneta Romberg for allergies and is at risk so:  - The proper method of use, as well as anticipated side effects, of a metered-dose inhaler were  discussed and demonstrated to the patient using teach back method   - reviewed the rule of 2s and the overlap of asthma and gerd rx   - GERD diet/ bed blocks reviewed and h2 hs added for now to see if still perceives need for saba or still has flares requiring symb 80 2 bid prn Based on two studies from NEJM  378; 20 p 1865 (2018) and 380 : p2020-30 (2019) in pts with mild asthma it is reasonable to use low dose symbicort eg 80 2bid "prn" flare in this setting but I emphasized this was only shown with symbicort and takes advantage of the rapid onset of action but is not the same as "rescue therapy" but can be stopped once the acute symptoms have resolved and the need for rescue has been minimized (< 2 x weekly)            Each maintenance medication was reviewed in detail including emphasizing most importantly the difference between maintenance and prns and under what circumstances the prns are to be triggered using an action plan format where appropriate.  Total time for H and P, chart review, counseling, reviewing hfa device(s) and generating customized AVS unique to this initial office visit / same day charting  > 45 min           Christinia Gully, MD 04/06/2021

## 2021-04-07 ENCOUNTER — Other Ambulatory Visit: Payer: Self-pay | Admitting: Obstetrics and Gynecology

## 2021-04-07 DIAGNOSIS — N63 Unspecified lump in unspecified breast: Secondary | ICD-10-CM

## 2021-04-07 DIAGNOSIS — R922 Inconclusive mammogram: Secondary | ICD-10-CM

## 2021-04-17 ENCOUNTER — Other Ambulatory Visit: Payer: Self-pay

## 2021-04-17 ENCOUNTER — Ambulatory Visit
Admission: RE | Admit: 2021-04-17 | Discharge: 2021-04-17 | Disposition: A | Discharge status: Home / Self Care | Payer: 59 | Source: Ambulatory Visit | Attending: Obstetrics and Gynecology | Admitting: Obstetrics and Gynecology

## 2021-04-17 DIAGNOSIS — R922 Inconclusive mammogram: Secondary | ICD-10-CM

## 2021-04-17 DIAGNOSIS — N63 Unspecified lump in unspecified breast: Secondary | ICD-10-CM

## 2021-05-13 ENCOUNTER — Encounter: Payer: Self-pay | Admitting: Gastroenterology

## 2021-05-13 ENCOUNTER — Ambulatory Visit (INDEPENDENT_AMBULATORY_CARE_PROVIDER_SITE_OTHER): Payer: 59 | Admitting: Gastroenterology

## 2021-05-13 ENCOUNTER — Other Ambulatory Visit: Payer: Self-pay

## 2021-05-13 VITALS — BP 108/74 | HR 78 | Temp 98.4°F | Ht 62.0 in | Wt 151.0 lb

## 2021-05-13 DIAGNOSIS — K219 Gastro-esophageal reflux disease without esophagitis: Secondary | ICD-10-CM

## 2021-05-13 MED ORDER — PANTOPRAZOLE SODIUM 40 MG PO TBEC: 40.0000 mg | DELAYED_RELEASE_TABLET | Freq: Every day | ORAL | 1 refills | Status: DC

## 2021-05-13 NOTE — Progress Notes (Signed)
? ? ?Primary Care Physician: Alfredia Ferguson, PA-C ? ?Primary Gastroenterologist:  Dr. Midge Minium ? ?Chief Complaint  ?Patient presents with  ? New Patient (Initial Visit)  ? ? ?HPI: Heather Robertson is a 51 y.o. female here with a history of seeing me last year for a colonoscopy due to a family history.  The patient had a small polyp that was hyperplastic and was recommended to have repeat colonoscopy in 5 years. The patient had seen her primary care provider in January and reported some feeling of chest pain that she describes as attacks with burning that lasts from 20 minutes to a few hours.  She had reported that she had a workup by a cardiologist which was negative and treated these symptoms to acid reflux.  The patient has taken Tums which decrease or eliminate her symptoms.  The patient had modified her diet and reported that the symptoms had improved. The patient was seen by pulmonology in January and they recommended lifting the head of the bed and diet changes for her GERD. ?She reports the chest pain can wake her up from sleep.  The patient reports that she saw a holistic doctor who put her on a paleo diet after discussing the possibility that the patient has leaky gut syndrome.  The patient states that her symptoms have completely abated since then and she is feeling much better and has lost 25 pounds.  The patient also reports that the only thing bothering her now is a cough when she is sleeping and a chronic feeling of clearing her throat. ? ?Past Medical History:  ?Diagnosis Date  ? Medical history non-contributory   ? Polyp of descending colon   ? ? ?Current Outpatient Medications  ?Medication Sig Dispense Refill  ? Amylase-Lipase-Protease (ENZYMAX PO) Take by mouth.    ? clotrimazole-betamethasone (LOTRISONE) cream Apply 1 application topically 2 (two) times daily. 45 g 3  ? Multiple Vitamin (MULTIVITAMIN) capsule Take 1 capsule by mouth daily.    ? Probiotic Product (PROBIOTIC-10 PO) Take by  mouth.    ? triamcinolone cream (KENALOG) 0.1 % Apply 1 application topically 2 (two) times daily. 90 g 2  ? ?No current facility-administered medications for this visit.  ? ? ?Allergies as of 05/13/2021 - Review Complete 05/13/2021  ?Allergen Reaction Noted  ? Azithromycin Hives 06/19/2015  ? Bactrim [sulfamethoxazole-trimethoprim] Nausea And Vomiting 05/28/2017  ? Carbapenems  05/03/2016  ? Cephalosporins  05/03/2016  ? Morphine and related Hives 05/03/2016  ? Penicillins Swelling 06/19/2015  ? Quaternium-15  05/03/2016  ? ? ?ROS: ? ?General: Negative for anorexia, weight loss, fever, chills, fatigue, weakness. ?ENT: Negative for hoarseness, difficulty swallowing , nasal congestion. ?CV: Negative for chest pain, angina, palpitations, dyspnea on exertion, peripheral edema.  ?Respiratory: Negative for dyspnea at rest, dyspnea on exertion, cough, sputum, wheezing.  ?GI: See history of present illness. ?GU:  Negative for dysuria, hematuria, urinary incontinence, urinary frequency, nocturnal urination.  ?Endo: Negative for unusual weight change.  ?  ?Physical Examination: ? ? BP 108/74   Pulse 78   Temp 98.4 ?F (36.9 ?C) (Oral)   Ht 5\' 2"  (1.575 m)   Wt 151 lb (68.5 kg)   BMI 27.62 kg/m?  ? ?General: Well-nourished, well-developed in no acute distress.  ?Eyes: No icterus. Conjunctivae pink. ?Neuro: Alert and oriented x 3.  Grossly intact. ?Skin: Warm and dry, no jaundice.   ?Psych: Alert and cooperative, normal mood and affect. ? ?Labs:  ?  ?Imaging Studies: ? BREAST LTD  UNI LEFT INC AXILLA ? ?Result Date: 04/17/2021 ?CLINICAL DATA:  51 year old female presenting with a new lump in the left breast. Patient is also reported recent weight loss of approximately 20 pounds. EXAM: DIGITAL DIAGNOSTIC BILATERAL MAMMOGRAM WITH TOMOSYNTHESIS AND CAD; ULTRASOUND LEFT BREAST LIMITED TECHNIQUE: Bilateral digital diagnostic mammography and breast tomosynthesis was performed. The images were evaluated with computer-aided  detection.; Targeted ultrasound examination of the left breast was performed. COMPARISON:  Previous exam(s). ACR Breast Density Category b: There are scattered areas of fibroglandular density. FINDINGS: Mammogram: Right breast: No suspicious mass, distortion, or microcalcifications are identified to suggest presence of malignancy. Left breast: A skin BB marks the palpable site of concern reported by the patient in the upper outer left breast. A spot tangential view of this area was performed in addition to standard views. There is no new abnormality at the palpable site or elsewhere in the left breast to suggest the presence of malignancy. On physical exam at the site of concern reported by the patient in the upper outer left breast do not feel a discrete mass or focal area of thickening. I do feel a small ridge or possibly a prominent fat lobule. Ultrasound: Targeted ultrasound performed at the palpable site of concern in the left breast at 1 o'clock 8 cm from the nipple demonstrating no cystic or solid mass. IMPRESSION: 1. No mammographic or sonographic evidence of malignancy or other imaging abnormality at the palpable site of concern in the left breast. 2.  No mammographic evidence of malignancy in the right breast. RECOMMENDATION: 1. Recommend any further workup of the palpable site in the left breast be on a clinical basis. 2.  Screening mammogram in one year.(Code:SM-B-01Y) I have discussed the findings and recommendations with the patient. If applicable, a reminder letter will be sent to the patient regarding the next appointment. BI-RADS CATEGORY  1: Negative. Electronically Signed   By: Emmaline KluverNancy  Ballantyne M.D.   On: 04/17/2021 10:04 ? ?MM DIAG BREAST TOMO BILATERAL ? ?Result Date: 04/17/2021 ?CLINICAL DATA:  51 year old female presenting with a new lump in the left breast. Patient is also reported recent weight loss of approximately 20 pounds. EXAM: DIGITAL DIAGNOSTIC BILATERAL MAMMOGRAM WITH TOMOSYNTHESIS  AND CAD; ULTRASOUND LEFT BREAST LIMITED TECHNIQUE: Bilateral digital diagnostic mammography and breast tomosynthesis was performed. The images were evaluated with computer-aided detection.; Targeted ultrasound examination of the left breast was performed. COMPARISON:  Previous exam(s). ACR Breast Density Category b: There are scattered areas of fibroglandular density. FINDINGS: Mammogram: Right breast: No suspicious mass, distortion, or microcalcifications are identified to suggest presence of malignancy. Left breast: A skin BB marks the palpable site of concern reported by the patient in the upper outer left breast. A spot tangential view of this area was performed in addition to standard views. There is no new abnormality at the palpable site or elsewhere in the left breast to suggest the presence of malignancy. On physical exam at the site of concern reported by the patient in the upper outer left breast do not feel a discrete mass or focal area of thickening. I do feel a small ridge or possibly a prominent fat lobule. Ultrasound: Targeted ultrasound performed at the palpable site of concern in the left breast at 1 o'clock 8 cm from the nipple demonstrating no cystic or solid mass. IMPRESSION: 1. No mammographic or sonographic evidence of malignancy or other imaging abnormality at the palpable site of concern in the left breast. 2.  No mammographic evidence of malignancy in the  right breast. RECOMMENDATION: 1. Recommend any further workup of the palpable site in the left breast be on a clinical basis. 2.  Screening mammogram in one year.(Code:SM-B-01Y) I have discussed the findings and recommendations with the patient. If applicable, a reminder letter will be sent to the patient regarding the next appointment. BI-RADS CATEGORY  1: Negative. Electronically Signed   By: Emmaline Kluver M.D.   On: 04/17/2021 10:04  ? ?Assessment and Plan:  ? ?DEANZA UPPERMAN is a 51 y.o. y/o female who comes in today with a  history of extraintestinal manifestations of reflux with clearing of her throat and a cough.  The patient has been recommended to start a PPI to see if that helps her symptoms thereby indicating that it is from reflux.  The

## 2021-06-16 ENCOUNTER — Ambulatory Visit: Payer: Self-pay | Admitting: Gastroenterology

## 2021-06-22 ENCOUNTER — Encounter: Payer: Self-pay | Admitting: Physician Assistant

## 2021-06-22 ENCOUNTER — Ambulatory Visit (INDEPENDENT_AMBULATORY_CARE_PROVIDER_SITE_OTHER): Payer: 59 | Admitting: Physician Assistant

## 2021-06-22 VITALS — BP 96/50 | HR 75 | Ht 62.0 in | Wt 148.3 lb

## 2021-06-22 DIAGNOSIS — M5432 Sciatica, left side: Secondary | ICD-10-CM | POA: Diagnosis not present

## 2021-06-22 DIAGNOSIS — E7800 Pure hypercholesterolemia, unspecified: Secondary | ICD-10-CM | POA: Diagnosis not present

## 2021-06-22 NOTE — Progress Notes (Signed)
?  ? ?I,Sha'taria Tyson,acting as a Neurosurgeon for [employer], PA-C.,have documented all relevant documentation on the behalf of Alfredia Ferguson, PA-C,as directed by  Alfredia Ferguson, PA-C while in the presence of Alfredia Ferguson, PA-C. ? ? ?Established patient visit ? ? ?Patient: Heather Robertson   DOB: 1970-08-17   51 y.o. Female  MRN: [id number] ?Visit Date: 06/22/2021 ? ?Today's healthcare provider: Alfredia Ferguson, PA-C  ? ?Cc. Hld f/u ? ?Subjective  ?  ?HPI  ?Patient reports burning sensation on lower left leg for about 3 weeks now. Reports pressing on it and it seems to be more when she is sitting that she notices it more. Denies injury to back, knee. Denies rash. Reports historical low back/sciatica problems. ?Lipid/Cholesterol, Follow-up ? ?Last lipid panel Other pertinent labs  ?Lab Results  ?Component Value Date  ? CHOL 241 (H) 10/27/2020  ? HDL 39 (L) 10/27/2020  ? LDLCALC 176 (H) 10/27/2020  ? TRIG 142 10/27/2020  ? CHOLHDL 6.2 (H) 10/27/2020  ? Lab Results  ?Component Value Date  ? ALT 15 10/27/2020  ? AST 16 10/27/2020  ? PLT 338 03/24/2021  ? TSH 2.190 10/27/2020  ?  ? ? ?Symptoms: ?No chest pain No chest pressure/discomfort  ?No dyspnea No lower extremity edema  ?No numbness or tingling of extremity No orthopnea  ?No palpitations No paroxysmal nocturnal dyspnea  ?No speech difficulty No syncope  ? ?Current diet:  autoimmune diet since thanksgiving ?Current exercise:  spin and different exercise classes ? ?The 10-year ASCVD risk score (Arnett DK, et al., 2019) is: 1.5% ? ?--------------------------------------------------------------------------------------------------- ? ? ?Medications: ?Outpatient Medications Prior to Visit  ?Medication Sig  ? Amylase-Lipase-Protease (ENZYMAX PO) Take by mouth.  ? Multiple Vitamin (MULTIVITAMIN) capsule Take 1 capsule by mouth daily.  ? [DISCONTINUED] clotrimazole-betamethasone (LOTRISONE) cream Apply 1 application topically 2 (two) times daily. (Patient not taking:  Reported on 06/22/2021)  ? [DISCONTINUED] pantoprazole (PROTONIX) 40 MG tablet Take 1 tablet (40 mg total) by mouth daily. (Patient not taking: Reported on 06/22/2021)  ? [DISCONTINUED] Probiotic Product (PROBIOTIC-10 PO) Take by mouth. (Patient not taking: Reported on 06/22/2021)  ? [DISCONTINUED] triamcinolone cream (KENALOG) 0.1 % Apply 1 application topically 2 (two) times daily. (Patient not taking: Reported on 06/22/2021)  ? ?No facility-administered medications prior to visit.  ? ? ?Review of Systems  ?Constitutional:  Negative for fatigue and fever.  ?Respiratory:  Negative for cough and shortness of breath.   ?Cardiovascular:  Negative for chest pain and leg swelling.  ?Gastrointestinal:  Negative for abdominal pain.  ?Neurological:  Negative for dizziness and headaches.  ? ?  Objective  ?  ?BP (!) 96/50   Pulse 75   Ht 5\' 2"  (1.575 m)   Wt 148 lb 4.8 oz (67.3 kg)   SpO2 99%   BMI 27.12 kg/m?  ?BP Readings from Last 3 Encounters:  ?06/22/21 (!) 96/50  ?05/13/21 108/74  ?04/06/21 112/80  ? ?Wt Readings from Last 3 Encounters:  ?06/22/21 148 lb 4.8 oz (67.3 kg)  ?05/13/21 151 lb (68.5 kg)  ?04/06/21 152 lb 12.8 oz (69.3 kg)  ? ?  ? ?Physical Exam ?Constitutional:   ?   General: She is awake.  ?   Appearance: She is well-developed.  ?HENT:  ?   Head: Normocephalic.  ?Eyes:  ?   Conjunctiva/sclera: Conjunctivae normal.  ?Cardiovascular:  ?   Rate and Rhythm: Normal rate and regular rhythm.  ?   Heart sounds: Normal heart sounds.  ?Pulmonary:  ?  Effort: Pulmonary effort is normal.  ?   Breath sounds: Normal breath sounds.  ?Skin: ?   General: Skin is warm.  ?Neurological:  ?   Mental Status: She is alert and oriented to person, place, and time.  ?Psychiatric:     ?   Attention and Perception: Attention normal.     ?   Mood and Affect: Mood normal.     ?   Speech: Speech normal.     ?   Behavior: Behavior is cooperative.  ?  ? ?No results found for any visits on 06/22/21. ? Assessment & Plan  ?  ? ?Problem  List Items Addressed This Visit   ? ?  ? Nervous and Auditory  ? Sciatic nerve pain, left  ?  Seems to be referred nerve pain, recommended stretching/heat/chiropractor  ? ?  ?  ?  ? Other  ? Pure hypercholesterolemia - Primary  ?  Unmedicated, pt trying to improve cholesterol with diet/ exercise. ?Recheck lipid panel  ? ? ?  ?  ? Relevant Orders  ? Lipid Profile  ?  ? ? ? ?I, Alfredia Ferguson, PA-C have reviewed all documentation for this visit. The documentation on  06/22/2021 for the exam, diagnosis, procedures, and orders are all accurate and complete. ? ?Alfredia Ferguson, PA-C ?[facility] ?[address] ?[place], [place], [address] ?Office: [phone] ?Fax: [phone]  ? ? Medical Group ? ?

## 2021-06-22 NOTE — Assessment & Plan Note (Signed)
Seems to be referred nerve pain, recommended stretching/heat/chiropractor  ?

## 2021-06-22 NOTE — Assessment & Plan Note (Signed)
Unmedicated, pt trying to improve cholesterol with diet/ exercise. ?Recheck lipid panel  ? ?

## 2021-06-23 DIAGNOSIS — E7800 Pure hypercholesterolemia, unspecified: Secondary | ICD-10-CM | POA: Diagnosis not present

## 2021-06-24 ENCOUNTER — Encounter (INDEPENDENT_AMBULATORY_CARE_PROVIDER_SITE_OTHER): Payer: 59 | Admitting: Physician Assistant

## 2021-06-24 DIAGNOSIS — E7800 Pure hypercholesterolemia, unspecified: Secondary | ICD-10-CM

## 2021-06-24 LAB — LIPID PANEL
Chol/HDL Ratio: 6.1 ratio — ABNORMAL HIGH (ref 0.0–4.4)
Cholesterol, Total: 257 mg/dL — ABNORMAL HIGH (ref 100–199)
HDL: 42 mg/dL (ref >39)
LDL Chol Calc (NIH): 187 mg/dL — ABNORMAL HIGH (ref 0–99)
Triglycerides: 152 mg/dL — ABNORMAL HIGH (ref 0–149)
VLDL Cholesterol Cal: 28 mg/dL (ref 5–40)

## 2021-07-08 NOTE — Telephone Encounter (Signed)
Please see the MyChart message reply(ies) for my assessment and plan.  ?  ?This patient gave consent for this Medical Advice Message and is aware that it may result in a bill to [employer], as well as the possibility of receiving a bill for a co-payment or deductible. They are an established patient, but are not seeking medical advice exclusively about a problem treated during an in person or video visit in the last seven days. I did not recommend an in person or video visit within seven days of my reply.  ?  ?I spent a total of 18 minutes cumulative time within 7 days through [employer]. ? ?Alfredia Ferguson, PA-C  ? ?

## 2021-07-13 DIAGNOSIS — L400 Psoriasis vulgaris: Secondary | ICD-10-CM | POA: Diagnosis not present

## 2021-07-13 DIAGNOSIS — L538 Other specified erythematous conditions: Secondary | ICD-10-CM | POA: Diagnosis not present

## 2021-07-13 DIAGNOSIS — L820 Inflamed seborrheic keratosis: Secondary | ICD-10-CM | POA: Diagnosis not present

## 2021-08-21 ENCOUNTER — Ambulatory Visit: Payer: Self-pay

## 2021-08-21 ENCOUNTER — Emergency Department
Admission: EM | Admit: 2021-08-21 | Discharge: 2021-08-21 | Disposition: A | Discharge status: Home / Self Care | Payer: 59 | Attending: Emergency Medicine | Admitting: Emergency Medicine

## 2021-08-21 ENCOUNTER — Other Ambulatory Visit: Payer: Self-pay

## 2021-08-21 DIAGNOSIS — R42 Dizziness and giddiness: Secondary | ICD-10-CM | POA: Diagnosis not present

## 2021-08-21 DIAGNOSIS — R519 Headache, unspecified: Secondary | ICD-10-CM | POA: Insufficient documentation

## 2021-08-21 DIAGNOSIS — R55 Syncope and collapse: Secondary | ICD-10-CM

## 2021-08-21 LAB — BASIC METABOLIC PANEL
Anion gap: 7 (ref 5–15)
BUN: 18 mg/dL (ref 6–20)
CO2: 26 mmol/L (ref 22–32)
Calcium: 9.5 mg/dL (ref 8.9–10.3)
Chloride: 106 mmol/L (ref 98–111)
Creatinine, Ser: 0.65 mg/dL (ref 0.44–1.00)
GFR, Estimated: >60 mL/min (ref >60)
Glucose, Bld: 108 mg/dL — ABNORMAL HIGH (ref 70–99)
Potassium: 3.9 mmol/L (ref 3.5–5.1)
Sodium: 139 mmol/L (ref 135–145)

## 2021-08-21 LAB — CBC
HCT: 42.8 % (ref 36.0–46.0)
Hemoglobin: 14 g/dL (ref 12.0–15.0)
MCH: 29.2 pg (ref 26.0–34.0)
MCHC: 32.7 g/dL (ref 30.0–36.0)
MCV: 89.4 fL (ref 80.0–100.0)
Platelets: 331 10*3/uL (ref 150–400)
RBC: 4.79 MIL/uL (ref 3.87–5.11)
RDW: 13 % (ref 11.5–15.5)
WBC: 7.9 10*3/uL (ref 4.0–10.5)
nRBC: 0 % (ref 0.0–0.2)

## 2021-08-21 LAB — TROPONIN I (HIGH SENSITIVITY)
Troponin I (High Sensitivity): <2 ng/L (ref <18)
Troponin I (High Sensitivity): <2 ng/L (ref <18)

## 2021-08-21 NOTE — ED Triage Notes (Signed)
Pt to ED for dizziness and headache at 1400 after drinking latte, reports feeling like she might black out and seeing floaters. Was recommended to come by PCP for cardiac workup.  States feels normal now besides jittery feelings.  Denies weakness. Denies pain at this time. NAD noted.
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
## 2021-10-05 DIAGNOSIS — L821 Other seborrheic keratosis: Secondary | ICD-10-CM | POA: Diagnosis not present

## 2021-10-05 DIAGNOSIS — Z85828 Personal history of other malignant neoplasm of skin: Secondary | ICD-10-CM | POA: Diagnosis not present

## 2021-10-05 DIAGNOSIS — D2262 Melanocytic nevi of left upper limb, including shoulder: Secondary | ICD-10-CM | POA: Diagnosis not present

## 2021-10-05 DIAGNOSIS — D2261 Melanocytic nevi of right upper limb, including shoulder: Secondary | ICD-10-CM | POA: Diagnosis not present

## 2021-10-05 DIAGNOSIS — L400 Psoriasis vulgaris: Secondary | ICD-10-CM | POA: Diagnosis not present

## 2021-11-17 DIAGNOSIS — Z1231 Encounter for screening mammogram for malignant neoplasm of breast: Secondary | ICD-10-CM | POA: Diagnosis not present

## 2021-11-17 DIAGNOSIS — Z1331 Encounter for screening for depression: Secondary | ICD-10-CM | POA: Diagnosis not present

## 2021-11-17 DIAGNOSIS — R232 Flushing: Secondary | ICD-10-CM | POA: Diagnosis not present

## 2021-11-17 DIAGNOSIS — Z01419 Encounter for gynecological examination (general) (routine) without abnormal findings: Secondary | ICD-10-CM | POA: Diagnosis not present

## 2022-02-17 ENCOUNTER — Other Ambulatory Visit: Payer: Self-pay | Admitting: Physician Assistant

## 2022-02-17 ENCOUNTER — Ambulatory Visit: Payer: 59 | Admitting: Physician Assistant

## 2022-02-17 ENCOUNTER — Telehealth: Payer: Self-pay | Admitting: Physician Assistant

## 2022-02-17 ENCOUNTER — Encounter: Payer: Self-pay | Admitting: Physician Assistant

## 2022-02-17 VITALS — BP 108/59 | HR 77 | Ht 62.0 in | Wt 170.7 lb

## 2022-02-17 DIAGNOSIS — J45991 Cough variant asthma: Secondary | ICD-10-CM

## 2022-02-17 MED ORDER — BUDESONIDE-FORMOTEROL FUMARATE 80-4.5 MCG/ACT IN AERO: 2.0000 | INHALATION_SPRAY | Freq: Two times a day (BID) | RESPIRATORY_TRACT | 3 refills | Status: AC

## 2022-02-17 MED ORDER — FLUTICASONE FUROATE-VILANTEROL 100-25 MCG/ACT IN AEPB: 1.0000 | INHALATION_SPRAY | Freq: Every day | RESPIRATORY_TRACT | 0 refills | Status: DC

## 2022-02-17 MED ORDER — ALBUTEROL SULFATE HFA 108 (90 BASE) MCG/ACT IN AERS: 2.0000 | INHALATION_SPRAY | Freq: Four times a day (QID) | RESPIRATORY_TRACT | 2 refills | Status: AC | PRN

## 2022-02-17 NOTE — Progress Notes (Signed)
     I,Sha'taria Tyson,acting as a Neurosurgeon for [employer], PA-C.,have documented all relevant documentation on the behalf of Alfredia Ferguson, PA-C,as directed by  Alfredia Ferguson, PA-C while in the presence of Alfredia Ferguson, PA-C.   Established patient visit   Patient: Heather Robertson   DOB: 28-Dec-1970   51 y.o. Female  MRN: [id number] Visit Date: 02/17/2022  Today's healthcare provider: Alfredia Ferguson, PA-C   Cc. Cough x 1 week  Subjective    HPI  Pt reports cough and coughing attacks x 1 week. History of asthma, used to use breo and albuterol prn, but breo is expired and does not have an albuterol inhaler.  Cough is sometimes productive with yellow mucous. Denies sob, wheezing, chest pain.   Medications: Outpatient Medications Prior to Visit  Medication Sig   Multiple Vitamin (MULTIVITAMIN) capsule Take 1 capsule by mouth daily.   Amylase-Lipase-Protease (ENZYMAX PO) Take by mouth.   No facility-administered medications prior to visit.    Review of Systems  Constitutional:  Negative for fatigue and fever.  Respiratory:  Positive for cough and chest tightness. Negative for shortness of breath.   Cardiovascular:  Negative for chest pain and leg swelling.  Gastrointestinal:  Negative for abdominal pain.  Neurological:  Negative for dizziness and headaches.      Objective    Blood pressure (!) 108/59, pulse 77, height 5\' 2"  (1.575 m), weight 170 lb 11.2 oz (77.4 kg), SpO2 99 %.   Physical Exam Constitutional:      General: She is awake.     Appearance: She is well-developed.  HENT:     Head: Normocephalic.  Eyes:     Conjunctiva/sclera: Conjunctivae normal.  Cardiovascular:     Rate and Rhythm: Normal rate and regular rhythm.     Heart sounds: Normal heart sounds.  Pulmonary:     Effort: Pulmonary effort is normal.     Breath sounds: Normal breath sounds.  Skin:    General: Skin is warm.  Neurological:     Mental Status: She is alert and oriented to person,  place, and time.  Psychiatric:        Attention and Perception: Attention normal.        Mood and Affect: Mood normal.        Speech: Speech normal.        Behavior: Behavior is cooperative.     No results found for any visits on 02/17/22.  Assessment & Plan     Problem List Items Addressed This Visit       Respiratory   Cough variant asthma with ? component of UACS - Primary    Dx by pulmonology 2/23  Refilled albuterol inhaler Refilled breo -- advised she may need seasonal daily use Any complications or persistent symptoms return to office      Relevant Medications   albuterol (VENTOLIN HFA) 108 (90 Base) MCG/ACT inhaler   fluticasone furoate-vilanterol (BREO ELLIPTA) 100-25 MCG/ACT AEPB     Return if symptoms worsen or fail to improve.      I, 3/23, PA-C have reviewed all documentation for this visit. The documentation on  02/17/2022 for the exam, diagnosis, procedures, and orders are all accurate and complete.  02/19/2022, PA-C [facility] [address] [place], [place], [place] Office: [phone] Fax: [phone]   [facility]

## 2022-02-17 NOTE — Assessment & Plan Note (Signed)
Dx by pulmonology 2/23  Refilled albuterol inhaler Refilled breo -- advised she may need seasonal daily use Any complications or persistent symptoms return to office

## 2022-02-17 NOTE — Telephone Encounter (Signed)
Pt calling stated medication fluticasone furoate-vilanterol (BREO ELLIPTA) 100-25 MCG/ACT AEPB is costing her over $300.00 even with good Rx lowest price $160.00 . Pt is requesting an alternative for this, if possible, and/or generic. Stated she did pick up medication albuterol (VENTOLIN HFA) 108 (90 Base) MCG/ACT inhaler.  Please advise.

## 2022-02-17 NOTE — Telephone Encounter (Signed)
Patient advised. Verbalized understanding.  Made patient aware if insurance does not cover and cost is too high to contact office back and samples will be provided per Lillia Abed.

## 2022-07-06 DIAGNOSIS — S86912A Strain of unspecified muscle(s) and tendon(s) at lower leg level, left leg, initial encounter: Secondary | ICD-10-CM | POA: Diagnosis not present

## 2022-07-06 DIAGNOSIS — M25572 Pain in left ankle and joints of left foot: Secondary | ICD-10-CM | POA: Diagnosis not present

## 2022-07-06 DIAGNOSIS — S93402A Sprain of unspecified ligament of left ankle, initial encounter: Secondary | ICD-10-CM | POA: Diagnosis not present

## 2022-10-11 DIAGNOSIS — L400 Psoriasis vulgaris: Secondary | ICD-10-CM | POA: Diagnosis not present

## 2022-10-11 DIAGNOSIS — D2261 Melanocytic nevi of right upper limb, including shoulder: Secondary | ICD-10-CM | POA: Diagnosis not present

## 2022-10-11 DIAGNOSIS — Z08 Encounter for follow-up examination after completed treatment for malignant neoplasm: Secondary | ICD-10-CM | POA: Diagnosis not present

## 2022-10-11 DIAGNOSIS — Z85828 Personal history of other malignant neoplasm of skin: Secondary | ICD-10-CM | POA: Diagnosis not present

## 2022-10-11 DIAGNOSIS — D485 Neoplasm of uncertain behavior of skin: Secondary | ICD-10-CM | POA: Diagnosis not present

## 2022-10-11 DIAGNOSIS — D2271 Melanocytic nevi of right lower limb, including hip: Secondary | ICD-10-CM | POA: Diagnosis not present

## 2022-10-11 DIAGNOSIS — D2262 Melanocytic nevi of left upper limb, including shoulder: Secondary | ICD-10-CM | POA: Diagnosis not present

## 2022-10-11 DIAGNOSIS — D225 Melanocytic nevi of trunk: Secondary | ICD-10-CM | POA: Diagnosis not present

## 2022-10-11 DIAGNOSIS — D2272 Melanocytic nevi of left lower limb, including hip: Secondary | ICD-10-CM | POA: Diagnosis not present

## 2023-07-26 ENCOUNTER — Other Ambulatory Visit: Payer: Self-pay | Admitting: Emergency Medicine

## 2023-07-26 DIAGNOSIS — R599 Enlarged lymph nodes, unspecified: Secondary | ICD-10-CM
